# Patient Record
Sex: Female | Born: 1945 | Race: White | Hispanic: No | Smoking: Never smoker
Health system: Southern US, Community
[De-identification: ages and names within clinical notes are randomized; demographics above are authoritative.]

## PROBLEM LIST (undated history)

## (undated) DIAGNOSIS — T8859XA Other complications of anesthesia, initial encounter: Secondary | ICD-10-CM

## (undated) DIAGNOSIS — I7300 Raynaud's syndrome without gangrene: Secondary | ICD-10-CM

## (undated) DIAGNOSIS — K589 Irritable bowel syndrome without diarrhea: Secondary | ICD-10-CM

## (undated) DIAGNOSIS — T4145XA Adverse effect of unspecified anesthetic, initial encounter: Secondary | ICD-10-CM

## (undated) DIAGNOSIS — Z9889 Other specified postprocedural states: Secondary | ICD-10-CM

## (undated) DIAGNOSIS — M1990 Unspecified osteoarthritis, unspecified site: Secondary | ICD-10-CM

## (undated) DIAGNOSIS — M8580 Other specified disorders of bone density and structure, unspecified site: Secondary | ICD-10-CM

## (undated) DIAGNOSIS — R112 Nausea with vomiting, unspecified: Secondary | ICD-10-CM

## (undated) HISTORY — PX: BREAST LUMPECTOMY: SHX2

## (undated) HISTORY — DX: Raynaud's syndrome without gangrene: I73.00

## (undated) HISTORY — DX: Irritable bowel syndrome, unspecified: K58.9

## (undated) HISTORY — DX: Other specified disorders of bone density and structure, unspecified site: M85.80

## (undated) HISTORY — DX: Unspecified osteoarthritis, unspecified site: M19.90

## (undated) HISTORY — PX: DILATION AND CURETTAGE OF UTERUS: SHX78

---

## 1999-01-01 ENCOUNTER — Ambulatory Visit (HOSPITAL_BASED_OUTPATIENT_CLINIC_OR_DEPARTMENT_OTHER): Admission: RE | Admit: 1999-01-01 | Discharge: 1999-01-01 | Payer: Self-pay | Admitting: Otolaryngology

## 1999-11-17 ENCOUNTER — Other Ambulatory Visit: Admission: RE | Admit: 1999-11-17 | Discharge: 1999-11-17 | Payer: Self-pay | Admitting: Obstetrics and Gynecology

## 2000-06-20 ENCOUNTER — Other Ambulatory Visit: Admission: RE | Admit: 2000-06-20 | Discharge: 2000-06-20 | Payer: Self-pay | Admitting: Obstetrics and Gynecology

## 2001-01-10 ENCOUNTER — Other Ambulatory Visit: Admission: RE | Admit: 2001-01-10 | Discharge: 2001-01-10 | Payer: Self-pay | Admitting: Obstetrics and Gynecology

## 2002-01-30 ENCOUNTER — Other Ambulatory Visit: Admission: RE | Admit: 2002-01-30 | Discharge: 2002-01-30 | Payer: Self-pay | Admitting: Obstetrics and Gynecology

## 2003-02-04 ENCOUNTER — Other Ambulatory Visit: Admission: RE | Admit: 2003-02-04 | Discharge: 2003-02-04 | Payer: Self-pay | Admitting: Obstetrics and Gynecology

## 2004-02-17 ENCOUNTER — Other Ambulatory Visit: Admission: RE | Admit: 2004-02-17 | Discharge: 2004-02-17 | Payer: Self-pay | Admitting: Obstetrics and Gynecology

## 2005-01-14 ENCOUNTER — Ambulatory Visit: Payer: Self-pay | Admitting: Internal Medicine

## 2005-01-28 ENCOUNTER — Ambulatory Visit: Payer: Self-pay | Admitting: Internal Medicine

## 2005-02-17 ENCOUNTER — Other Ambulatory Visit: Admission: RE | Admit: 2005-02-17 | Discharge: 2005-02-17 | Payer: Self-pay | Admitting: Obstetrics and Gynecology

## 2010-09-21 ENCOUNTER — Encounter: Payer: Self-pay | Admitting: Internal Medicine

## 2010-10-14 ENCOUNTER — Encounter (INDEPENDENT_AMBULATORY_CARE_PROVIDER_SITE_OTHER): Payer: Self-pay | Admitting: *Deleted

## 2010-10-15 ENCOUNTER — Ambulatory Visit
Admission: RE | Admit: 2010-10-15 | Discharge: 2010-10-15 | Payer: Self-pay | Source: Home / Self Care | Attending: Internal Medicine | Admitting: Internal Medicine

## 2010-10-21 NOTE — Letter (Signed)
Summary: [facility] Instructions  [facility]  [address] [place], [place] [address]   Phone: [phone]  Fax: [phone]       Elizabeth Kidd    1946-05-27    MRN: [id number]        Procedure Day Dorna Bloom:  Farrell Ours  10/29/10     Arrival Time:  10:00AM      Procedure Time:  11:00AM     Location of Procedure:                    Juliann Pares _  [facility] (4th Floor)                      PREPARATION FOR COLONOSCOPY WITH MOVIPREP   Starting 5 days prior to your procedure 10/24/10 do not eat nuts, seeds, popcorn, corn, beans, peas,  salads, or any raw vegetables.  Do not take any fiber supplements (e.g. Metamucil, Citrucel, and Benefiber).  THE DAY BEFORE YOUR PROCEDURE         DATE: 10/28/10  DAY: THURSDAY  1.  Drink clear liquids the entire day-NO SOLID FOOD  2.  Do not drink anything colored red or purple.  Avoid juices with pulp.  No orange juice.  3.  Drink at least 64 oz. (8 glasses) of fluid/clear liquids during the day to prevent dehydration and help the prep work efficiently.  CLEAR LIQUIDS INCLUDE: Water Jello Ice Popsicles Tea (sugar ok, no milk/cream) Powdered fruit flavored drinks Coffee (sugar ok, no milk/cream) Gatorade Juice: apple, white grape, white cranberry  Lemonade Clear bullion, consomm, broth Carbonated beverages (any kind) Strained chicken noodle soup Hard Candy                             4.  In the morning, mix first dose of MoviPrep solution:    Empty 1 Pouch A and 1 Pouch B into the disposable container    Add lukewarm drinking water to the top line of the container. Mix to dissolve    Refrigerate (mixed solution should be used within 24 hrs)  5.  Begin drinking the prep at 5:00 p.m. The MoviPrep container is divided by 4 marks.   Every 15 minutes drink the solution down to the next mark (approximately 8 oz) until the full liter is complete.   6.  Follow completed prep with 16 oz of clear liquid of your choice (Nothing  red or purple).  Continue to drink clear liquids until bedtime.  7.  Before going to bed, mix second dose of MoviPrep solution:    Empty 1 Pouch A and 1 Pouch B into the disposable container    Add lukewarm drinking water to the top line of the container. Mix to dissolve    Refrigerate  THE DAY OF YOUR PROCEDURE      DATE: 10/29/10  DAY: FRIDAY  Beginning at 6:00AM (5 hours before procedure):         1. Every 15 minutes, drink the solution down to the next mark (approx 8 oz) until the full liter is complete.  2. Follow completed prep with 16 oz. of clear liquid of your choice.    3. You may drink clear liquids until 9:00AM (2 HOURS BEFORE PROCEDURE).   MEDICATION INSTRUCTIONS  Unless otherwise instructed, you should take regular prescription medications with a small sip of water   as early as possible the morning of  your procedure.        OTHER INSTRUCTIONS  You will need a responsible adult at least 66 years of age to accompany you and drive you home.   This person must remain in the waiting room during your procedure.  Wear loose fitting clothing that is easily removed.  Leave jewelry and other valuables at home.  However, you may wish to bring a book to read or  an iPod/MP3 player to listen to music as you wait for your procedure to start.  Remove all body piercing jewelry and leave at home.  Total time from sign-in until discharge is approximately 2-3 hours.  You should go home directly after your procedure and rest.  You can resume normal activities the  day after your procedure.  The day of your procedure you should not:   Drive   Make legal decisions   Operate machinery   Drink alcohol   Return to work  You will receive specific instructions about eating, activities and medications before you leave.    The above instructions have been reviewed and explained to me by  Wyona Almas RN  October 15, 2010 11:36 AM     I fully understand and can  verbalize these instructions _____________________________ Date _________

## 2010-10-21 NOTE — Letter (Signed)
Summary: Pre Visit Letter Revised  [facility]  [address] [place], [place] [address]   Phone: [phone]  Fax: [phone]        09/21/2010 MRN: [id number]  Elizabeth Kidd [address] [place], [place]  [address]             Procedure Date:  2-10 at 11am             Dr Marina Goodell - Direct Colon  Welcome to the Gastroenterology Division at [facility].    You are scheduled to see a nurse for your pre-procedure visit on 10-15-10 at 11am on the 3rd floor at [facility], [address]  We ask that you try to arrive at our office 15 minutes prior to your appointment time to allow for check-in.  Please take a minute to review the attached form.  If you answer "Yes" to one or more of the questions on the first page, we ask that you call the person listed at your earliest opportunity.  If you answer "No" to all of the questions, please complete the rest of the form and bring it to your appointment.    Your nurse visit will consist of discussing your medical and surgical history, your immediate family medical history, and your medications.   If you are unable to list all of your medications on the form, please bring the medication bottles to your appointment and we will list them.  We will need to be aware of both prescribed and over the counter drugs.  We will need to know exact dosage information as well.    Please be prepared to read and sign documents such as consent forms, a financial agreement, and acknowledgement forms.  If necessary, and with your consent, a friend or relative is welcome to sit-in on the nurse visit with you.  Please bring your insurance card so that we may make a copy of it.  If your insurance requires a referral to see a specialist, please bring your referral form from your primary care physician.  No co-pay is required for this nurse visit.     If you cannot keep your appointment, please call [phone] to cancel or reschedule prior to  your appointment date.  This allows [place] the opportunity to schedule an appointment for another patient in need of care.    Thank you for choosing [facility] for your medical needs.  We appreciate the opportunity to care for you.  Please visit [place] at our website  to learn more about our practice.  Sincerely, The Gastroenterology Division

## 2010-10-21 NOTE — Miscellaneous (Signed)
Summary: [facility] Previsit/prep  Clinical Lists Changes  Medications: Added new medication of MOVIPREP 100 GM  SOLR (PEG-KCL-NACL-NASULF-NA ASC-C) As per prep instructions. - Signed Rx of MOVIPREP 100 GM  SOLR (PEG-KCL-NACL-NASULF-NA ASC-C) As per prep instructions.;  #1 x 0;  Signed;  Entered by: Wyona Almas RN;  Authorized by: Hilarie Fredrickson MD;  Method used: Electronically to [facility] [address] [place], [place], [place]  [address], Ph: [phone] or [phone], Fax: [phone] Observations: Added new observation of NKA: T (10/15/2010 10:37)    Prescriptions: MOVIPREP 100 GM  SOLR (PEG-KCL-NACL-NASULF-NA ASC-C) As per prep instructions.  #1 x 0   Entered by:   Wyona Almas RN   Authorized by:   Hilarie Fredrickson MD   Signed by:   Wyona Almas RN on 10/15/2010   Method used:   Electronically to        [facility] [address] (retail)       [address]       [place], [place]  [address]       Ph: [phone] or [phone]       Fax: [phone]   RxID:   [id number]

## 2010-10-29 ENCOUNTER — Other Ambulatory Visit: Payer: Self-pay | Admitting: Internal Medicine

## 2010-10-29 ENCOUNTER — Other Ambulatory Visit (AMBULATORY_SURGERY_CENTER): Payer: Medicare Other | Admitting: Internal Medicine

## 2010-10-29 DIAGNOSIS — Z1211 Encounter for screening for malignant neoplasm of colon: Secondary | ICD-10-CM

## 2010-10-29 DIAGNOSIS — D126 Benign neoplasm of colon, unspecified: Secondary | ICD-10-CM

## 2010-10-29 DIAGNOSIS — Z86010 Personal history of colonic polyps: Secondary | ICD-10-CM

## 2010-11-02 ENCOUNTER — Encounter: Payer: Self-pay | Admitting: Internal Medicine

## 2010-11-10 NOTE — Letter (Signed)
Summary: Patient Notice- Polyp Results  [facility]  [address] [place], [place] [address]   Phone: [phone]  Fax: [phone]        November 02, 2010 MRN: [id number]    [facility] [address] [place], [place]  [address]    Dear Ms. Elizabeth Kidd,  I am pleased to inform you that the colon polyp(s) removed during your recent colonoscopy was (were) found to be benign (no cancer detected) upon pathologic examination.  I recommend you have a repeat colonoscopy examination in 3 years to look for recurrent polyps, as having colon polyps increases your risk for having recurrent polyps or even colon cancer in the future.  Should you develop new or worsening symptoms of abdominal pain, bowel habit changes or bleeding from the rectum or bowels, please schedule an evaluation with either your primary care physician or with me.  Additional information/recommendations:  __ No further action with gastroenterology is needed at this time. Please      follow-up with your primary care physician for your other healthcare      needs.   Please call us if you are having persistent problems or have questions about your condition that have not been fully answered at this time.  Sincerely,  Hilarie Fredrickson MD  This letter has been electronically signed by your physician.  Appended Document: Patient Notice- Polyp Results Letter Mailed  Appended Document: Patient Notice- Polyp Results Letter mailed

## 2010-11-10 NOTE — Procedures (Signed)
Summary: Colonoscopy   Colonoscopy  Procedure date:  10/29/2010  Findings:      Location:  [facility].   COLONOSCOPY PROCEDURE REPORT  PATIENT:  Elizabeth Kidd, Elizabeth Kidd  MR#:  [id number] BIRTHDATE:   02/08/1946, 64 yrs. old   GENDER:   female ENDOSCOPIST:   Wilhemina Bonito. Eda Keys, MD REF. BY: Surveillance Program Recall, PROCEDURE DATE:  10/29/2010 PROCEDURE:  Colonoscopy with snare polypectomy x 1 ASA CLASS:   Class II INDICATIONS: history of pre-cancerous (adenomatous) colon polyps, surveillance and high-risk screening ; index 01-2005 w/ small adenomas MEDICATIONS:    Fentanyl 50 mcg IV, Versed 5 mg IV  DESCRIPTION OF PROCEDURE:   After the risks benefits and alternatives of the procedure were thoroughly explained, informed consent was obtained.  Digital rectal exam was performed and revealed no abnormalities.   The LB 180AL [id number] endoscope was introduced through the anus and advanced to the cecum, which was identified by both the appendix and ileocecal valve, without limitations.Time to cecum = 2:42 min.  The quality of the prep was excellent, using MoviPrep.  The instrument was then slowly withdrawn (time = 14:20 min) as the colon was fully examined. <<PROCEDUREIMAGES>>        <<OLD IMAGES>>  FINDINGS:  A 12mm sessile polyp was found in the ascending colon. Polyp was snared without cautery. Retrieval was successful.   Otherwise normal colonoscopy without other polyps, masses, vascular ectasias, or inflammatory changes.   Retroflexed views in the rectum revealed no abnormalities.    The scope was then withdrawn from the patient and the procedure completed.  COMPLICATIONS:   None ENDOSCOPIC IMPRESSION:  1) 12mm Sessile polyp in the ascending colon - removed  2) Otherwise normall colonoscopy   RECOMMENDATIONS:  1) Repeat Colonoscopy in 3 years (advanced lesion).  _______________________________ Wilhemina Bonito. Eda Keys, MD  CC: Geoffry Paradise, MD; The Patient    Appended  Document: Colonoscopy recall 3 yrs     Procedures Next Due Date:    Colonoscopy: 10/2013

## 2011-03-19 ENCOUNTER — Emergency Department (HOSPITAL_BASED_OUTPATIENT_CLINIC_OR_DEPARTMENT_OTHER)
Admission: EM | Admit: 2011-03-19 | Discharge: 2011-03-20 | Disposition: A | Discharge status: Home / Self Care | Payer: Medicare Other | Attending: Emergency Medicine | Admitting: Emergency Medicine

## 2011-03-19 ENCOUNTER — Emergency Department (INDEPENDENT_AMBULATORY_CARE_PROVIDER_SITE_OTHER): Payer: Medicare Other

## 2011-03-19 DIAGNOSIS — R350 Frequency of micturition: Secondary | ICD-10-CM | POA: Insufficient documentation

## 2011-03-19 DIAGNOSIS — R509 Fever, unspecified: Secondary | ICD-10-CM | POA: Insufficient documentation

## 2011-03-19 DIAGNOSIS — IMO0001 Reserved for inherently not codable concepts without codable children: Secondary | ICD-10-CM | POA: Insufficient documentation

## 2011-03-19 DIAGNOSIS — R51 Headache: Secondary | ICD-10-CM

## 2011-03-19 DIAGNOSIS — J438 Other emphysema: Secondary | ICD-10-CM

## 2011-03-19 LAB — BASIC METABOLIC PANEL
Chloride: 101 mEq/L (ref 96–112)
GFR calc Af Amer: >60 mL/min (ref >60)
Potassium: 3.7 mEq/L (ref 3.5–5.1)

## 2011-03-19 LAB — CBC
Platelets: 178 10*3/uL (ref 150–400)
RDW: 15.7 % — ABNORMAL HIGH (ref 11.5–15.5)
WBC: 9.2 10*3/uL (ref 4.0–10.5)

## 2011-03-19 LAB — URINALYSIS, ROUTINE W REFLEX MICROSCOPIC
Bilirubin Urine: NEGATIVE
Glucose, UA: NEGATIVE mg/dL
Hgb urine dipstick: NEGATIVE
Ketones, ur: NEGATIVE mg/dL
pH: 7 (ref 5.0–8.0)

## 2011-03-19 LAB — DIFFERENTIAL
Basophils Absolute: 0 10*3/uL (ref 0.0–0.1)
Eosinophils Absolute: 0.1 10*3/uL (ref 0.0–0.7)
Eosinophils Relative: 1 % (ref 0–5)

## 2011-03-26 LAB — CULTURE, BLOOD (ROUTINE X 2)
Culture  Setup Time: 201207010056
Culture: NO GROWTH

## 2013-10-25 ENCOUNTER — Encounter: Payer: Self-pay | Admitting: Internal Medicine

## 2013-12-02 ENCOUNTER — Encounter: Payer: Self-pay | Admitting: Internal Medicine

## 2013-12-31 ENCOUNTER — Ambulatory Visit (AMBULATORY_SURGERY_CENTER): Payer: Self-pay

## 2013-12-31 VITALS — Ht 59.0 in | Wt 117.0 lb

## 2013-12-31 DIAGNOSIS — Z860100 Personal history of colon polyps, unspecified: Secondary | ICD-10-CM

## 2013-12-31 MED ORDER — MOVIPREP 100 G PO SOLR: 1.0000 | Freq: Once | ORAL | Status: DC

## 2014-01-02 ENCOUNTER — Encounter: Payer: Self-pay | Admitting: Internal Medicine

## 2014-01-21 ENCOUNTER — Encounter: Payer: Self-pay | Admitting: Internal Medicine

## 2014-01-21 ENCOUNTER — Ambulatory Visit (AMBULATORY_SURGERY_CENTER): Payer: Medicare Other | Admitting: Internal Medicine

## 2014-01-21 VITALS — BP 126/67 | HR 65 | Temp 97.6°F | Resp 16 | Ht 59.0 in | Wt 117.0 lb

## 2014-01-21 DIAGNOSIS — D126 Benign neoplasm of colon, unspecified: Secondary | ICD-10-CM

## 2014-01-21 DIAGNOSIS — Z86010 Personal history of colonic polyps: Secondary | ICD-10-CM

## 2014-01-21 MED ORDER — SODIUM CHLORIDE 0.9 % IV SOLN: 500.0000 mL | INTRAVENOUS | Status: DC

## 2014-01-21 NOTE — Op Note (Signed)
[facility]  [address] [place], [address]   COLONOSCOPY PROCEDURE REPORT  PATIENT: Elizabeth Kidd, Elizabeth Kidd  MR#: [id number] BIRTHDATE: [birth year] , 67  yrs. old GENDER: Female ENDOSCOPIST: Eustace Quail, MD REFERRED SR:[id number] Program Recall PROCEDURE DATE:  01/21/2014 PROCEDURE:   Colonoscopy with snare polypectomy x 1 First Screening Colonoscopy - Avg.  risk and is 50 yrs.  old or older - No.  Prior Negative Screening - Now for repeat screening. N/A  History of Adenoma - Now for follow-up colonoscopy & has been > or = to 3 yrs.  Yes hx of adenoma.  Has been 3 or more years since last colonoscopy.  Polyps Removed Today? Yes. ASA CLASS:   Class I INDICATIONS:Patient's personal history of adenomatous colon polyps. Small TA 2006; 40mm TA 10-2010 MEDICATIONS: MAC sedation, administered by CRNA and propofol (Diprivan) 230mg  IV  DESCRIPTION OF PROCEDURE:   After the risks benefits and alternatives of the procedure were thoroughly explained, informed consent was obtained.  A digital rectal exam revealed no abnormalities of the rectum.   The LB [id number]  endoscope was introduced through the anus and advanced to the cecum, which was identified by both the appendix and ileocecal valve. No adverse events experienced.   The quality of the prep was adequate, using MoviPrep  The instrument was then slowly withdrawn as the colon was fully examined.  COLON FINDINGS: A single polyp measuring 6 mm in size was found in the sigmoid colon.  A polypectomy was performed with a cold snare. The resection was complete and the polyp tissue was completely retrieved.   The colon was otherwise normal.  There was no diverticulosis, inflammation, other polyps or cancers . Retroflexed views revealed no abnormalities. The time to cecum=4 minutes 30 seconds.  Withdrawal time=12 minutes 26 seconds.  The scope was withdrawn and the procedure completed. COMPLICATIONS: There were no  complications.  ENDOSCOPIC IMPRESSION: 1.   Single polyp measuring 6 mm in size was found in the sigmoid colon; polypectomy was performed with a cold snare 2.   The colon was otherwise normal  RECOMMENDATIONS: 1. Follow up colonoscopy in 5 years   eSigned:  Eustace Quail, MD 01/21/2014 8:49 AM   cc: Burnard Bunting, MD and The Patient

## 2014-01-21 NOTE — Progress Notes (Signed)
Called to room to assist during endoscopic procedure.  Patient ID and intended procedure confirmed with present staff. Received instructions for my participation in the procedure from the performing physician.  

## 2014-01-21 NOTE — Patient Instructions (Signed)
YOU HAD AN ENDOSCOPIC PROCEDURE TODAY AT THE [facility]: Refer to the procedure report that was given to you for any specific questions about what was found during the examination.  If the procedure report does not answer your questions, please call your gastroenterologist to clarify.  If you requested that your care partner not be given the details of your procedure findings, then the procedure report has been included in a sealed envelope for you to review at your convenience later.  YOU SHOULD EXPECT: Some feelings of bloating in the abdomen. Passage of more gas than usual.  Walking can help get rid of the air that was put into your GI tract during the procedure and reduce the bloating. If you had a lower endoscopy (such as a colonoscopy or flexible sigmoidoscopy) you may notice spotting of blood in your stool or on the toilet paper. If you underwent a bowel prep for your procedure, then you may not have a normal bowel movement for a few days.  DIET: Your first meal following the procedure should be a light meal and then it is ok to progress to your normal diet.  A half-sandwich or bowl of soup is an example of a good first meal.  Heavy or fried foods are harder to digest and may make you feel nauseous or bloated.  Likewise meals heavy in dairy and vegetables can cause extra gas to form and this can also increase the bloating.  Drink plenty of fluids but you should avoid alcoholic beverages for 24 hours.  ACTIVITY: Your care partner should take you home directly after the procedure.  You should plan to take it easy, moving slowly for the rest of the day.  You can resume normal activity the day after the procedure however you should NOT DRIVE or use heavy machinery for 24 hours (because of the sedation medicines used during the test).    SYMPTOMS TO REPORT IMMEDIATELY: A gastroenterologist can be reached at any hour.  During normal business hours, 8:30 AM to 5:00 PM Monday through Friday,  call [phone].  After hours and on weekends, please call the GI answering service at [phone] who will take a message and have the physician on call contact you.   Following lower endoscopy (colonoscopy or flexible sigmoidoscopy):  Excessive amounts of blood in the stool  Significant tenderness or worsening of abdominal pains  Swelling of the abdomen that is new, acute  Fever of 100F or higher    FOLLOW UP: If any biopsies were taken you will be contacted by phone or by letter within the next 1-3 weeks.  Call your gastroenterologist if you have not heard about the biopsies in 3 weeks.  Our staff will call the home number listed on your records the next business day following your procedure to check on you and address any questions or concerns that you may have at that time regarding the information given to you following your procedure. This is a courtesy call and so if there is no answer at the home number and we have not heard from you through the emergency physician on call, we will assume that you have returned to your regular daily activities without incident.  SIGNATURES/CONFIDENTIALITY: You and/or your care partner have signed paperwork which will be entered into your electronic medical record.  These signatures attest to the fact that that the information above on your After Visit Summary has been reviewed and is understood.  Full responsibility of the confidentiality   of this discharge information lies with you and/or your care-partner.     

## 2014-01-21 NOTE — Progress Notes (Signed)
Report to pacu rn, vss, bbs=clear 

## 2014-01-22 ENCOUNTER — Telehealth: Payer: Self-pay | Admitting: *Deleted

## 2014-01-22 NOTE — Telephone Encounter (Signed)
  Follow up Call-  Call back number 01/21/2014  Post procedure Call Back phone  # [phone]  Permission to leave phone message Yes     Patient questions:  Do you have a fever, pain , or abdominal swelling? no Pain Score  0 *  Have you tolerated food without any problems? yes  Have you been able to return to your normal activities? yes  Do you have any questions about your discharge instructions: Diet   no Medications  no Follow up visit  no  Do you have questions or concerns about your Care? no  Actions: * If pain score is 4 or above: No action needed, pain <4.

## 2014-01-28 ENCOUNTER — Encounter: Payer: Self-pay | Admitting: Internal Medicine

## 2014-05-07 ENCOUNTER — Encounter: Payer: Self-pay | Admitting: Internal Medicine

## 2014-10-04 ENCOUNTER — Emergency Department (HOSPITAL_BASED_OUTPATIENT_CLINIC_OR_DEPARTMENT_OTHER)
Admission: EM | Admit: 2014-10-04 | Discharge: 2014-10-04 | Disposition: A | Discharge status: Home / Self Care | Payer: Medicare Other | Attending: Emergency Medicine | Admitting: Emergency Medicine

## 2014-10-04 ENCOUNTER — Encounter (HOSPITAL_BASED_OUTPATIENT_CLINIC_OR_DEPARTMENT_OTHER): Payer: Self-pay | Admitting: Emergency Medicine

## 2014-10-04 DIAGNOSIS — Z8742 Personal history of other diseases of the female genital tract: Secondary | ICD-10-CM | POA: Diagnosis not present

## 2014-10-04 DIAGNOSIS — K644 Residual hemorrhoidal skin tags: Secondary | ICD-10-CM | POA: Insufficient documentation

## 2014-10-04 DIAGNOSIS — L259 Unspecified contact dermatitis, unspecified cause: Secondary | ICD-10-CM | POA: Insufficient documentation

## 2014-10-04 DIAGNOSIS — B379 Candidiasis, unspecified: Secondary | ICD-10-CM

## 2014-10-04 DIAGNOSIS — R21 Rash and other nonspecific skin eruption: Secondary | ICD-10-CM | POA: Diagnosis present

## 2014-10-04 DIAGNOSIS — B3789 Other sites of candidiasis: Secondary | ICD-10-CM | POA: Diagnosis not present

## 2014-10-04 DIAGNOSIS — Z79899 Other long term (current) drug therapy: Secondary | ICD-10-CM | POA: Insufficient documentation

## 2014-10-04 DIAGNOSIS — K649 Unspecified hemorrhoids: Secondary | ICD-10-CM

## 2014-10-04 MED ORDER — NYSTATIN-TRIAMCINOLONE 100000-0.1 UNIT/GM-% EX CREA: TOPICAL_CREAM | CUTANEOUS | Status: DC

## 2014-10-04 NOTE — ED Notes (Signed)
Pt presents to ED with complaints of allergic reaction for the past few days and getting worse. PT used hemorrhoid cream and now has a weeping rash in that area.

## 2014-10-04 NOTE — ED Provider Notes (Signed)
TIME SEEN: 12:00 PM  CHIEF COMPLAINT: Possible allergic reaction  HPI: Patient is a 69 y.o. F with history of benign breast tumor status post lumpectomy who presents emergency department with complaints of hemorrhoids and now a rash around that area after using over-the-counter Benadryl cream. States that she has been using Preparation H for hemorrhoids but they began to itch so she put Benadryl cream around her bottom. Since that time she has noticed increased redness, swelling, clear drainage and itching. She is concerned that she may have a contact dermatitis. No other new soaps, lotions, detergents, medications or foods. No other rash.  ROS: See HPI Constitutional: no fever  Eyes: no drainage  ENT: no runny nose   Cardiovascular:  no chest pain  Resp: no SOB  GI: no vomiting GU: no dysuria Integumentary: no rash  Allergy: no hives  Musculoskeletal: no leg swelling  Neurological: no slurred speech ROS otherwise negative  PAST MEDICAL HISTORY/PAST SURGICAL HISTORY:  History reviewed. No pertinent past medical history.  MEDICATIONS:  Prior to Admission medications   Medication Sig Start Date End Date Taking? Authorizing Provider  OVER THE COUNTER MEDICATION [place] [employer]-Plus vitamin daily    Historical Provider, MD    ALLERGIES:  No Known Allergies  SOCIAL HISTORY:  History  Substance Use Topics  . Smoking status: Never Smoker   . Smokeless tobacco: Never Used  . Alcohol Use: No    FAMILY HISTORY: Family History  Problem Relation Age of Onset  . Breast cancer Mother   . Colon cancer Neg Hx   . Pancreatic cancer Neg Hx   . Stomach cancer Neg Hx   . Rectal cancer Neg Hx     EXAM: BP 144/84 mmHg  Pulse 126  Temp(Src) 99.1 F (37.3 C) (Oral)  Resp 20  Ht 4\' 11"  (1.499 m)  Wt 117 lb (53.071 kg)  BMI 23.62 kg/m2  SpO2 97% CONSTITUTIONAL: Alert and oriented and responds appropriately to questions. Well-appearing; well-nourished HEAD: Normocephalic EYES:  Conjunctivae clear, PERRL ENT: normal nose; no rhinorrhea; moist mucous membranes; pharynx without lesions noted NECK: Supple, no meningismus, no LAD  CARD: RRR; S1 and S2 appreciated; no murmurs, no clicks, no rubs, no gallops RESP: Normal chest excursion without splinting or tachypnea; breath sounds clear and equal bilaterally; no wheezes, no rhonchi, no rales,  ABD/GI: Normal bowel sounds; non-distended; soft, non-tender, no rebound, no guarding RECTAL:  Patient has multiple nonbleeding, nonthrombosed external hemorrhoids, she has beefy-red areas around her anus and gluteal fold with clear drainage and excoriations, there are some small satellite lesions, no perineal crepitus or purulent drainage BACK:  The back appears normal and is non-tender to palpation, there is no CVA tenderness EXT: Normal ROM in all joints; non-tender to palpation; no edema; normal capillary refill; no cyanosis    SKIN: Normal color for age and race; warm NEURO: Moves all extremities equally PSYCH: The patient's mood and manner are appropriate. Grooming and personal hygiene are appropriate.  MEDICAL DECISION MAKING: Patient here with what appears to be a contact dermatitis versus yeast infection around the rectum. No sign of rectal or perineal abscess. No sign of necrotizing fasciitis. No signs of cellulitis. She is initially tachycardic but this improved without intervention. We'll discharge with prescription for nystatin/triamcinolone cream and will have her use over-the-counter barrier creams. Have advised her to stop using Benadryl cream. As for her hemorrhoids have recommend she continue Preparation H, Tucks witch hazel wipes, increasing water and fiber intake to keep her stool  soft. Discussed return precautions. She verbalized understanding and is comfortable with plan.     [place], DO 10/04/14 1554

## 2014-10-04 NOTE — Discharge Instructions (Signed)
Contact Dermatitis Contact dermatitis is a reaction to certain substances that touch the skin. Contact dermatitis can be either irritant contact dermatitis or allergic contact dermatitis. Irritant contact dermatitis does not require previous exposure to the substance for a reaction to occur.Allergic contact dermatitis only occurs if you have been exposed to the substance before. Upon a repeat exposure, your body reacts to the substance.  CAUSES  Many substances can cause contact dermatitis. Irritant dermatitis is most commonly caused by repeated exposure to mildly irritating substances, such as:  Makeup.  Soaps.  Detergents.  Bleaches.  Acids.  Metal salts, such as nickel. Allergic contact dermatitis is most commonly caused by exposure to:  Poisonous plants.  Chemicals (deodorants, shampoos).  Jewelry.  Latex.  Neomycin in triple antibiotic cream.  Preservatives in products, including clothing. SYMPTOMS  The area of skin that is exposed may develop:  Dryness or flaking.  Redness.  Cracks.  Itching.  Pain or a burning sensation.  Blisters. With allergic contact dermatitis, there may also be swelling in areas such as the eyelids, mouth, or genitals.  DIAGNOSIS  Your caregiver can usually tell what the problem is by doing a physical exam. In cases where the cause is uncertain and an allergic contact dermatitis is suspected, a patch skin test may be performed to help determine the cause of your dermatitis. TREATMENT Treatment includes protecting the skin from further contact with the irritating substance by avoiding that substance if possible. Barrier creams, powders, and gloves may be helpful. Your caregiver may also recommend:  Steroid creams or ointments applied 2 times daily. For best results, soak the rash area in cool water for 20 minutes. Then apply the medicine. Cover the area with a plastic wrap. You can store the steroid cream in the refrigerator for a "chilly"  effect on your rash. That may decrease itching. Oral steroid medicines may be needed in more severe cases.  Antibiotics or antibacterial ointments if a skin infection is present.  Antihistamine lotion or an antihistamine taken by mouth to ease itching.  Lubricants to keep moisture in your skin.  Burow's solution to reduce redness and soreness or to dry a weeping rash. Mix one packet or tablet of solution in 2 cups cool water. Dip a clean washcloth in the mixture, wring it out a bit, and put it on the affected area. Leave the cloth in place for 30 minutes. Do this as often as possible throughout the day.  Taking several cornstarch or baking soda baths daily if the area is too large to cover with a washcloth. Harsh chemicals, such as alkalis or acids, can cause skin damage that is like a burn. You should flush your skin for 15 to 20 minutes with cold water after such an exposure. You should also seek immediate medical care after exposure. Bandages (dressings), antibiotics, and pain medicine may be needed for severely irritated skin.  HOME CARE INSTRUCTIONS  Avoid the substance that caused your reaction.  Keep the area of skin that is affected away from hot water, soap, sunlight, chemicals, acidic substances, or anything else that would irritate your skin.  Do not scratch the rash. Scratching may cause the rash to become infected.  You may take cool baths to help stop the itching.  Only take over-the-counter or prescription medicines as directed by your caregiver.  See your caregiver for follow-up care as directed to make sure your skin is healing properly. SEEK MEDICAL CARE IF:   Your condition is not better after 3  days of treatment.  You seem to be getting worse.  You see signs of infection such as swelling, tenderness, redness, soreness, or warmth in the affected area.  You have any problems related to your medicines. Document Released: 09/02/2000 Document Revised: 11/28/2011  Document Reviewed: 02/08/2011 [facility] Patient Information 2015 [place], [place]. This information is not intended to replace advice given to you by your health care provider. Make sure you discuss any questions you have with your health care provider.  Candida Infection A Candida infection (also called yeast, fungus, and Monilia infection) is an overgrowth of yeast that can occur anywhere on the body. A yeast infection commonly occurs in warm, moist body areas. Usually, the infection remains localized but can spread to become a systemic infection. A yeast infection may be a sign of a more severe disease such as diabetes, leukemia, or AIDS. A yeast infection can occur in both men and women. In women, Candida vaginitis is a vaginal infection. It is one of the most common causes of vaginitis. Men usually do not have symptoms or know they have an infection until other problems develop. Men may find out they have a yeast infection because their sex partner has a yeast infection. Uncircumcised men are more likely to get a yeast infection than circumcised men. This is because the uncircumcised glans is not exposed to air and does not remain as dry as that of a circumcised glans. Older adults may develop yeast infections around dentures. CAUSES  Women  Antibiotics.  Steroid medication taken for a long time.  Being overweight (obese).  Diabetes.  Poor immune condition.  Certain serious medical conditions.  Immune suppressive medications for organ transplant patients.  Chemotherapy.  Pregnancy.  Menstruation.  Stress and fatigue.  Intravenous drug use.  Oral contraceptives.  Wearing tight-fitting clothes in the crotch area.  Catching it from a sex partner who has a yeast infection.  Spermicide.  Intravenous, urinary, or other catheters. Men  Catching it from a sex partner who has a yeast infection.  Having oral or anal sex with a person who has the  infection.  Spermicide.  Diabetes.  Antibiotics.  Poor immune system.  Medications that suppress the immune system.  Intravenous drug use.  Intravenous, urinary, or other catheters. SYMPTOMS  Women  Thick, white vaginal discharge.  Vaginal itching.  Redness and swelling in and around the vagina.  Irritation of the lips of the vagina and perineum.  Blisters on the vaginal lips and perineum.  Painful sexual intercourse.  Low blood sugar (hypoglycemia).  Painful urination.  Bladder infections.  Intestinal problems such as constipation, indigestion, bad breath, bloating, increase in gas, diarrhea, or loose stools. Men  Men may develop intestinal problems such as constipation, indigestion, bad breath, bloating, increase in gas, diarrhea, or loose stools.  Dry, cracked skin on the penis with itching or discomfort.  Jock itch.  Dry, flaky skin.  Athlete's foot.  Hypoglycemia. DIAGNOSIS  Women  A history and an exam are performed.  The discharge may be examined under a microscope.  A culture may be taken of the discharge. Men  A history and an exam are performed.  Any discharge from the penis or areas of cracked skin will be looked at under the microscope and cultured.  Stool samples may be cultured. TREATMENT  Women  Vaginal antifungal suppositories and creams.  Medicated creams to decrease irritation and itching on the outside of the vagina.  Warm compresses to the perineal area to decrease swelling and  discomfort.  Oral antifungal medications.  Medicated vaginal suppositories or cream for repeated or recurrent infections.  Wash and dry the irritation areas before applying the cream.  Eating yogurt with Lactobacillus may help with prevention and treatment.  Sometimes painting the vagina with gentian violet solution may help if creams and suppositories do not work. Men  Antifungal creams and oral antifungal medications.  Sometimes  treatment must continue for 30 days after the symptoms go away to prevent recurrence. HOME CARE INSTRUCTIONS  Women  Use cotton underwear and avoid tight-fitting clothing.  Avoid colored, scented toilet paper and deodorant tampons or pads.  Do not douche.  Keep your diabetes under control.  Finish all the prescribed medications.  Keep your skin clean and dry.  Consume milk or yogurt with Lactobacillus-active culture regularly. If you get frequent yeast infections and think that is what the infection is, there are over-the-counter medications that you can get. If the infection does not show healing in 3 days, talk to your caregiver.  Tell your sex partner you have a yeast infection. Your partner may need treatment also, especially if your infection does not clear up or recurs. Men  Keep your skin clean and dry.  Keep your diabetes under control.  Finish all prescribed medications.  Tell your sex partner that you have a yeast infection so he or she can be treated if necessary. SEEK MEDICAL CARE IF:   Your symptoms do not clear up or worsen in one week after treatment.  You have an oral temperature above 102 F (38.9 C).  You have trouble swallowing or eating for a prolonged time.  You develop blisters on and around your vagina.  You develop vaginal bleeding and it is not your menstrual period.  You develop abdominal pain.  You develop intestinal problems as mentioned above.  You get weak or light-headed.  You have painful or increased urination.  You have pain during sexual intercourse. MAKE SURE YOU:   Understand these instructions.  Will watch your condition.  Will get help right away if you are not doing well or get worse. Document Released: 10/13/2004 Document Revised: 01/20/2014 Document Reviewed: 01/25/2010 [facility] Patient Information 2015 [place], [place]. This information is not intended to replace advice given to you by your health care provider. Make  sure you discuss any questions you have with your health care provider.  Hemorrhoids Hemorrhoids are swollen veins around the rectum or anus. There are two types of hemorrhoids:   Internal hemorrhoids. These occur in the veins just inside the rectum. They may poke through to the outside and become irritated and painful.  External hemorrhoids. These occur in the veins outside the anus and can be felt as a painful swelling or hard lump near the anus. CAUSES  Pregnancy.   Obesity.   Constipation or diarrhea.   Straining to have a bowel movement.   Sitting for long periods on the toilet.  Heavy lifting or other activity that caused you to strain.  Anal intercourse. SYMPTOMS   Pain.   Anal itching or irritation.   Rectal bleeding.   Fecal leakage.   Anal swelling.   One or more lumps around the anus.  DIAGNOSIS  Your caregiver may be able to diagnose hemorrhoids by visual examination. Other examinations or tests that may be performed include:   Examination of the rectal area with a gloved hand (digital rectal exam).   Examination of anal canal using a small tube (scope).   A blood test if  you have lost a significant amount of blood.  A test to look inside the colon (sigmoidoscopy or colonoscopy). TREATMENT Most hemorrhoids can be treated at home. However, if symptoms do not seem to be getting better or if you have a lot of rectal bleeding, your caregiver may perform a procedure to help make the hemorrhoids get smaller or remove them completely. Possible treatments include:   Placing a rubber band at the base of the hemorrhoid to cut off the circulation (rubber band ligation).   Injecting a chemical to shrink the hemorrhoid (sclerotherapy).   Using a tool to burn the hemorrhoid (infrared light therapy).   Surgically removing the hemorrhoid (hemorrhoidectomy).   Stapling the hemorrhoid to block blood flow to the tissue (hemorrhoid stapling).  HOME  CARE INSTRUCTIONS   Eat foods with fiber, such as whole grains, beans, nuts, fruits, and vegetables. Ask your doctor about taking products with added fiber in them (fibersupplements).  Increase fluid intake. Drink enough water and fluids to keep your urine clear or pale yellow.   Exercise regularly.   Go to the bathroom when you have the urge to have a bowel movement. Do not wait.   Avoid straining to have bowel movements.   Keep the anal area dry and clean. Use wet toilet paper or moist towelettes after a bowel movement.   Medicated creams and suppositories may be used or applied as directed.   Only take over-the-counter or prescription medicines as directed by your caregiver.   Take warm sitz baths for 15-20 minutes, 3-4 times a day to ease pain and discomfort.   Place ice packs on the hemorrhoids if they are tender and swollen. Using ice packs between sitz baths may be helpful.   Put ice in a plastic bag.   Place a towel between your skin and the bag.   Leave the ice on for 15-20 minutes, 3-4 times a day.   Do not use a donut-shaped pillow or sit on the toilet for long periods. This increases blood pooling and pain.  SEEK MEDICAL CARE IF:  You have increasing pain and swelling that is not controlled by treatment or medicine.  You have uncontrolled bleeding.  You have difficulty or you are unable to have a bowel movement.  You have pain or inflammation outside the area of the hemorrhoids. MAKE SURE YOU:  Understand these instructions.  Will watch your condition.  Will get help right away if you are not doing well or get worse. Document Released: 09/02/2000 Document Revised: 08/22/2012 Document Reviewed: 07/10/2012 [facility] Patient Information 2015 [place], [place]. This information is not intended to replace advice given to you by your health care provider. Make sure you discuss any questions you have with your health care provider.

## 2017-10-31 ENCOUNTER — Other Ambulatory Visit (INDEPENDENT_AMBULATORY_CARE_PROVIDER_SITE_OTHER): Payer: Medicare Other

## 2017-10-31 ENCOUNTER — Encounter: Payer: Self-pay | Admitting: Internal Medicine

## 2017-10-31 ENCOUNTER — Encounter (INDEPENDENT_AMBULATORY_CARE_PROVIDER_SITE_OTHER): Payer: Self-pay

## 2017-10-31 ENCOUNTER — Ambulatory Visit: Payer: Medicare Other | Admitting: Internal Medicine

## 2017-10-31 VITALS — BP 124/76 | HR 80 | Ht 59.0 in | Wt 106.4 lb

## 2017-10-31 DIAGNOSIS — R109 Unspecified abdominal pain: Secondary | ICD-10-CM

## 2017-10-31 DIAGNOSIS — R17 Unspecified jaundice: Secondary | ICD-10-CM | POA: Diagnosis not present

## 2017-10-31 DIAGNOSIS — R634 Abnormal weight loss: Secondary | ICD-10-CM

## 2017-10-31 DIAGNOSIS — R195 Other fecal abnormalities: Secondary | ICD-10-CM | POA: Diagnosis not present

## 2017-10-31 LAB — COMPREHENSIVE METABOLIC PANEL
ALK PHOS: 524 U/L — AB (ref 39–117)
ALT: 81 U/L — ABNORMAL HIGH (ref 0–35)
AST: 84 U/L — AB (ref 0–37)
Albumin: 3.9 g/dL (ref 3.5–5.2)
BUN: 15 mg/dL (ref 6–23)
CALCIUM: 9.5 mg/dL (ref 8.4–10.5)
CO2: 30 mEq/L (ref 19–32)
CREATININE: 0.83 mg/dL (ref 0.40–1.20)
Chloride: 102 mEq/L (ref 96–112)
GFR: 71.77 mL/min (ref >60.00)
Glucose, Bld: 121 mg/dL — ABNORMAL HIGH (ref 70–99)
POTASSIUM: 4.3 meq/L (ref 3.5–5.1)
Sodium: 139 mEq/L (ref 135–145)
TOTAL PROTEIN: 7.2 g/dL (ref 6.0–8.3)
Total Bilirubin: 9.4 mg/dL — ABNORMAL HIGH (ref 0.2–1.2)

## 2017-10-31 LAB — CBC WITH DIFFERENTIAL/PLATELET
BASOS ABS: 0 10*3/uL (ref 0.0–0.1)
Basophils Relative: 0.9 % (ref 0.0–3.0)
EOS ABS: 0.1 10*3/uL (ref 0.0–0.7)
Eosinophils Relative: 1.3 % (ref 0.0–5.0)
HCT: 34.2 % — ABNORMAL LOW (ref 36.0–46.0)
Hemoglobin: 11.5 g/dL — ABNORMAL LOW (ref 12.0–15.0)
Lymphocytes Relative: 13 % (ref 12.0–46.0)
Lymphs Abs: 0.7 10*3/uL (ref 0.7–4.0)
MCHC: 33.5 g/dL (ref 30.0–36.0)
MCV: 101.3 fl — ABNORMAL HIGH (ref 78.0–100.0)
MONO ABS: 0.5 10*3/uL (ref 0.1–1.0)
Monocytes Relative: 9.6 % (ref 3.0–12.0)
Neutro Abs: 4.2 10*3/uL (ref 1.4–7.7)
Neutrophils Relative %: 75.2 % (ref 43.0–77.0)
Platelets: 170 10*3/uL (ref 150.0–400.0)
RBC: 3.38 Mil/uL — AB (ref 3.87–5.11)
RDW: 20 % — ABNORMAL HIGH (ref 11.5–15.5)
WBC: 5.6 10*3/uL (ref 4.0–10.5)

## 2017-10-31 LAB — PROTIME-INR
INR: 1.2 ratio — ABNORMAL HIGH (ref 0.8–1.0)
Prothrombin Time: 13.3 s — ABNORMAL HIGH (ref 9.6–13.1)

## 2017-10-31 NOTE — Progress Notes (Signed)
HISTORY OF PRESENT ILLNESS:  Elizabeth Kidd is a 72 y.o. female with minimal past medical history as listed below. She sent today by her primary care provider Dr. Reynaldo Minium for evaluation of abdominal complaints. The patient was last seen in this office May 2015 when she underwent surveillance colonoscopy for history of adenomatous colon polyps. The examination was normal except for the presence of a 6 mm sigmoid colon polyp which was removed and found to be adenomatous. Follow-up in 5 years recommended. Patient tells me that her current history is that of 2 months of bloating with nausea and occasional left-sided and suprapubic discomfort. She mentions that postprandially there is nausea without vomiting. She reports 5-6 pound weight loss. She was evaluated and felt possibly to have constipation for which she was treated with MiraLAX which resulted in diarrhea. She has mention some urgency to move her bowels but simply passes mucus. Outside office note evaluated. Rectal exam revealed heme positive stool. Patient states that she just doesn't feel well. Review of outside x-rays from 09/26/2017 she was unremarkable plain film of the abdomen. Review of laboratories from that same day reveals hemoglobin of 11.7 with MCV 101.5. Normal chemistries. No liver tests obtained. To me the patient is obviously jaundiced. Upon questioning she reports a one-month history of dark urine and a several week history of clay-colored stools. Her gallbladder is intact. No family history of pancreatic cancer.  REVIEW OF SYSTEMS:  All non-GI ROS negative unless otherwise stated in the history of present illness except for fatigue, arthritis  Past Medical History:  Diagnosis Date  . IBS (irritable bowel syndrome)   . Osteoarthritis   . Osteopenia   . Raynaud's disease     Past Surgical History:  Procedure Laterality Date  . BREAST LUMPECTOMY     fibroid tumor benign 1987    Social History Elizabeth Kidd  reports that   has never smoked. she has never used smokeless tobacco. She reports that she does not drink alcohol or use drugs.  family history includes Breast cancer in her mother.  No Known Allergies     PHYSICAL EXAMINATION: Vital signs: BP 124/76   Pulse 80   Ht 4\' 11"  (1.499 m)   Wt 106 lb 6.4 oz (48.3 kg)   BMI 21.49 kg/m   Constitutional: Thin, jaundiced, but generally well-appearing, no acute distress Psychiatric: alert and oriented x3, cooperative. Concerned appearing Eyes: extraocular movements intact, obvious scleral icterus, conjunctiva pink Mouth: oral pharynx moist, no lesions Neck: supple no lymphadenopathy Cardiovascular: heart regular rate and rhythm, no murmur Lungs: clear to auscultation bilaterally Abdomen: soft, nontender, slightly distended, no obvious ascites, no peritoneal signs, normal bowel sounds, no organomegaly Rectal: Per PCP last month. Not repeated Extremities: no clubbing, cyanosis, or lower extremity edema bilaterally Skin: no lesions on visible extremities Neuro: No focal deficits. Cranial nerves intact  ASSESSMENT:  #1. Abdominal bloating discomfort with nausea and abdominal distention. Obvious jaundice on today's examination. I am quite concerned about the possibility of occult malignancy with biliary obstruction. Alternatively, could have obstruction secondary to gallstones or nonobstructive hepatic cholestasis. #2. Hemoccult-positive stool #3. History of adenomatous colon polyps. Last colonoscopy 2015   PLAN:  #1. Laboratories today including CBC, comprehensive metabolic panel, PT/INR #2. Urgent contrast-enhanced CT scan of the abdomen and pelvis to evaluate abdominal discomfort, weight loss, and jaundice #3. Further recommendations after the above data available   60 minutes spent face-to-face with the patient. Greater than 50% a time use for counseling and coordination of  care regarding her abdominal complaints, jaundice, and weight loss.

## 2017-10-31 NOTE — Patient Instructions (Signed)
Your physician has requested that you go to the basement for the following lab work before leaving today: cbc, cmet, pt inr  You have been scheduled for a CT scan of the abdomen and pelvis at [facility] ([address]---this is in the same building as Press [facility]).   You are scheduled on 11/09/2017 at 2:30pm. You should arrive 15 minutes prior to your appointment time for registration. Please follow the written instructions below on the day of your exam:  WARNING: IF YOU ARE ALLERGIC TO IODINE/X-RAY DYE, PLEASE NOTIFY RADIOLOGY IMMEDIATELY AT [phone]! YOU WILL BE GIVEN A 13 HOUR PREMEDICATION PREP.  1) Do not eat anything after 10:30am (4 hours prior to your test) 2) You have been given 2 bottles of oral contrast to drink. The solution may taste               better if refrigerated, but do NOT add ice or any other liquid to this solution. Shake             well before drinking.    Drink 1 bottle of contrast @ 12:30pm (2 hours prior to your exam)  Drink 1 bottle of contrast @ 1:30pm (1 hour prior to your exam)  You may take any medications as prescribed with a small amount of water except for the following: Metformin, Glucophage, Glucovance, Avandamet, Riomet, Fortamet, Actoplus Met, Janumet, Glumetza or Metaglip. The above medications must be held the day of the exam AND 48 hours after the exam.  The purpose of you drinking the oral contrast is to aid in the visualization of your intestinal tract. The contrast solution may cause some diarrhea. Before your exam is started, you will be given a small amount of fluid to drink. Depending on your individual set of symptoms, you may also receive an intravenous injection of x-ray contrast/dye. Plan on being at [facility] for 30 minutes or long, depending on the type of exam you are having performed.  If you have any questions regarding your exam or if you need to reschedule, you may call the CT department at [phone]  between the hours of 8:00 am and 5:00 pm, Monday-Friday.  ________________________________________________________________________

## 2017-11-03 ENCOUNTER — Other Ambulatory Visit: Payer: Self-pay

## 2017-11-03 ENCOUNTER — Ambulatory Visit (INDEPENDENT_AMBULATORY_CARE_PROVIDER_SITE_OTHER)
Admission: RE | Admit: 2017-11-03 | Discharge: 2017-11-03 | Disposition: A | Discharge status: Home / Self Care | Payer: Medicare Other | Source: Ambulatory Visit | Attending: Internal Medicine | Admitting: Internal Medicine

## 2017-11-03 ENCOUNTER — Telehealth: Payer: Self-pay | Admitting: Internal Medicine

## 2017-11-03 ENCOUNTER — Encounter (HOSPITAL_COMMUNITY): Payer: Self-pay | Admitting: *Deleted

## 2017-11-03 DIAGNOSIS — R109 Unspecified abdominal pain: Secondary | ICD-10-CM

## 2017-11-03 DIAGNOSIS — K8689 Other specified diseases of pancreas: Secondary | ICD-10-CM

## 2017-11-03 MED ORDER — IOPAMIDOL (ISOVUE-300) INJECTION 61%: 100.0000 mL | Freq: Once | INTRAVENOUS | Status: DC | PRN

## 2017-11-03 NOTE — Telephone Encounter (Signed)
EUSERCP scheduled, pt instructed and medications reviewed.  Patient instructions mailed to home.  Patient to call with any questions or concerns.  

## 2017-11-03 NOTE — Telephone Encounter (Signed)
Elizabeth Kidd.  I can move some less urgent cases out a bit to make room for her next Thursday (Feb 21st). If you can let her know, Chong Sicilian will work on the scheduling. Imaging is so clear that I think it would be safe to begin referral process to medical oncology as well (will leave that to you so we don't confuse things). Thanks   Patty, This is the woman that needs upper EUS and ERCP for pancreatic mass, jaundice. Thursday Feb 21st.  Ok to move those two patients out till March 7th as we discussed.  Thanks  DJ

## 2017-11-03 NOTE — Telephone Encounter (Signed)
Spoke with pt and she is aware of bile duct blockage and will come in for OV with Dr. Henrene Pastor 11/07/17@2 :15pm. Pt aware of appt.

## 2017-11-03 NOTE — Telephone Encounter (Signed)
Vaughan Basta, Please let the patient know that she has a blockage of her bile duct (thus the jaundice) coming from her pancreas. Work her on to my schedule (already full) Tuesday the 19 th at 11:15 am. I will review things in detail with her in person. Thank you.  Linna Hoff, Do you have any availability for EUS with bx and/or ERCP your next 2 Thursdays? Thank you. Jenny Reichmann

## 2017-11-03 NOTE — Telephone Encounter (Signed)
Called by radiology - Dr. 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  Based upon what I can tell from chart it is not medically urgent to contact patient with this news but she will need to undergo further GI evaluation (ERCP/EUS?)

## 2017-11-06 NOTE — Telephone Encounter (Signed)
Thank you all 

## 2017-11-07 ENCOUNTER — Encounter: Payer: Self-pay | Admitting: Internal Medicine

## 2017-11-07 ENCOUNTER — Ambulatory Visit: Payer: Medicare Other | Admitting: Internal Medicine

## 2017-11-07 VITALS — BP 95/56 | HR 74 | Ht 59.0 in | Wt 106.0 lb

## 2017-11-07 DIAGNOSIS — K831 Obstruction of bile duct: Secondary | ICD-10-CM | POA: Diagnosis not present

## 2017-11-07 DIAGNOSIS — C259 Malignant neoplasm of pancreas, unspecified: Secondary | ICD-10-CM

## 2017-11-07 DIAGNOSIS — R17 Unspecified jaundice: Secondary | ICD-10-CM

## 2017-11-07 NOTE — Progress Notes (Signed)
HISTORY OF PRESENT ILLNESS:  Elizabeth Kidd is a 72 y.o. female who was evaluated one week ago today with abdominal discomfort and Hemoccult-positive stool. She was found to be jaundiced on physical exam. She subsequently underwent laboratories which revealed abnormal liver tests with AST 84, ALT 81, alkaline phosphatase 524, total bilirubin 9.4. Glucose 121. Hemoglobin 11.5. INR 1.2. Contrast-enhanced CT scan of the abdomen and pelvis was performed 11/03/2017. The patient was found to have a large aggressive appearing mass involving the head of the pancreas with infiltration into surrounding soft tissues with evidence of vascular involvement and biliary obstruction. Mass effect on the descending duodenum is well. Numerous bilateral pulmonary nodules felt to possibly represent metastatic disease. This follow-up appointment was arranged. She is accompanied by her friend. Patient has had no change in her symptom complex since her visit last week  REVIEW OF SYSTEMS:  All non-GI ROS unchanged from one week ago  Past Medical History:  Diagnosis Date  . Complication of anesthesia   . IBS (irritable bowel syndrome)   . Osteoarthritis   . Osteopenia   . PONV (postoperative nausea and vomiting)   . Raynaud's disease     Past Surgical History:  Procedure Laterality Date  . BREAST LUMPECTOMY     fibroid tumor benign 1987  . DILATION AND CURETTAGE OF UTERUS      Social History Elizabeth Kidd  reports that  has never smoked. she has never used smokeless tobacco. She reports that she does not drink alcohol or use drugs.  family history includes Breast cancer in her mother.  No Known Allergies     PHYSICAL EXAMINATION: Vital signs: BP (!) 95/56   Pulse 74   Ht 4\' 11"  (1.499 m)   Wt 106 lb (48.1 kg)   BMI 21.41 kg/m  General: Thin, no acute distress, obviously jaundiced HEENT: Sclerae are anicteric Abdomen: Not reexamined Psychiatric: alert and oriented x3.  Cooperative  ASSESSMENT:  #1. Advanced pancreatic cancer with biliary obstruction #2. Question metastatic disease to the lungs  PLAN:  #1. The patient has been set up for endoscopic ultrasound and ERCP with stent placement with Dr. Ardis Hughs this Thursday, February 21. I reviewed with the patient the nature of endoscopic ultrasound with biopsy as well as ERCP with stent placement. I discussed that occasionally biopsies are non-diagnostic despite having cancer. I discussed potential limitations, particularly with ERCP, should there be significant duodenal distortion. I discussed the potential need for percutaneous drainage.The nature of the procedure, as well as the risks, benefits, and alternatives were carefully and thoroughly reviewed with the patient. Ample time for discussion and questions allowed. The patient understood, was satisfied, and agreed to proceed. I also recommended that she pack a bag should she need to stay in the hospital overnight post procedure. #2. Referral to medical oncologist  30 minutes were spent face-to-face with the patient. Near the entire time was use for counseling regarding her newly diagnosed pancreatic mass, likely adenocarcinoma, its workup, management and implications. Multiple questions on the part of the patient and her friend answered to their satisfaction. I also provided them with a detailed diagram of the relevant anatomy.  A copy of this note has been sent to Dr. Reynaldo Minium Dr. Ardis Hughs

## 2017-11-07 NOTE — Patient Instructions (Signed)
You will be contacted by oncology to schedule an appointment

## 2017-11-07 NOTE — H&P (View-Only) (Signed)
HISTORY OF PRESENT ILLNESS:  Elizabeth Kidd is a 72 y.o. female who was evaluated one week ago today with abdominal discomfort and Hemoccult-positive stool. She was found to be jaundiced on physical exam. She subsequently underwent laboratories which revealed abnormal liver tests with AST 84, ALT 81, alkaline phosphatase 524, total bilirubin 9.4. Glucose 121. Hemoglobin 11.5. INR 1.2. Contrast-enhanced CT scan of the abdomen and pelvis was performed 11/03/2017. The patient was found to have a large aggressive appearing mass involving the head of the pancreas with infiltration into surrounding soft tissues with evidence of vascular involvement and biliary obstruction. Mass effect on the descending duodenum is well. Numerous bilateral pulmonary nodules felt to possibly represent metastatic disease. This follow-up appointment was arranged. She is accompanied by her friend. Patient has had no change in her symptom complex since her visit last week  REVIEW OF SYSTEMS:  All non-GI ROS unchanged from one week ago  Past Medical History:  Diagnosis Date  . Complication of anesthesia   . IBS (irritable bowel syndrome)   . Osteoarthritis   . Osteopenia   . PONV (postoperative nausea and vomiting)   . Raynaud's disease     Past Surgical History:  Procedure Laterality Date  . BREAST LUMPECTOMY     fibroid tumor benign 1987  . DILATION AND CURETTAGE OF UTERUS      Social History Elizabeth Kidd  reports that  has never smoked. she has never used smokeless tobacco. She reports that she does not drink alcohol or use drugs.  family history includes Breast cancer in her mother.  No Known Allergies     PHYSICAL EXAMINATION: Vital signs: BP (!) 95/56   Pulse 74   Ht 4\' 11"  (1.499 m)   Wt 106 lb (48.1 kg)   BMI 21.41 kg/m  General: Thin, no acute distress, obviously jaundiced HEENT: Sclerae are anicteric Abdomen: Not reexamined Psychiatric: alert and oriented x3.  Cooperative  ASSESSMENT:  #1. Advanced pancreatic cancer with biliary obstruction #2. Question metastatic disease to the lungs  PLAN:  #1. The patient has been set up for endoscopic ultrasound and ERCP with stent placement with Dr. Ardis Hughs this Thursday, February 21. I reviewed with the patient the nature of endoscopic ultrasound with biopsy as well as ERCP with stent placement. I discussed that occasionally biopsies are non-diagnostic despite having cancer. I discussed potential limitations, particularly with ERCP, should there be significant duodenal distortion. I discussed the potential need for percutaneous drainage.The nature of the procedure, as well as the risks, benefits, and alternatives were carefully and thoroughly reviewed with the patient. Ample time for discussion and questions allowed. The patient understood, was satisfied, and agreed to proceed. I also recommended that she pack a bag should she need to stay in the hospital overnight post procedure. #2. Referral to medical oncologist  30 minutes were spent face-to-face with the patient. Near the entire time was use for counseling regarding her newly diagnosed pancreatic mass, likely adenocarcinoma, its workup, management and implications. Multiple questions on the part of the patient and her friend answered to their satisfaction. I also provided them with a detailed diagram of the relevant anatomy.  A copy of this note has been sent to Dr. Reynaldo Minium Dr. Ardis Hughs

## 2017-11-09 ENCOUNTER — Inpatient Hospital Stay: Admission: RE | Admit: 2017-11-09 | Payer: Medicare Other | Source: Ambulatory Visit

## 2017-11-09 ENCOUNTER — Encounter (HOSPITAL_COMMUNITY): Payer: Self-pay

## 2017-11-09 ENCOUNTER — Inpatient Hospital Stay (HOSPITAL_COMMUNITY)
Admission: AD | Admit: 2017-11-09 | Discharge: 2017-11-12 | DRG: 435 | Disposition: A | Discharge status: Home Health | Payer: Medicare Other | Source: Ambulatory Visit | Attending: Internal Medicine | Admitting: Internal Medicine

## 2017-11-09 ENCOUNTER — Ambulatory Visit (HOSPITAL_COMMUNITY): Payer: Medicare Other | Admitting: Anesthesiology

## 2017-11-09 ENCOUNTER — Other Ambulatory Visit: Payer: Self-pay

## 2017-11-09 ENCOUNTER — Ambulatory Visit (HOSPITAL_COMMUNITY): Payer: Medicare Other

## 2017-11-09 ENCOUNTER — Encounter (HOSPITAL_COMMUNITY)
Admission: AD | Disposition: A | Discharge status: Home Health | Payer: Self-pay | Source: Ambulatory Visit | Attending: Internal Medicine

## 2017-11-09 DIAGNOSIS — C801 Malignant (primary) neoplasm, unspecified: Secondary | ICD-10-CM

## 2017-11-09 DIAGNOSIS — K831 Obstruction of bile duct: Secondary | ICD-10-CM | POA: Diagnosis present

## 2017-11-09 DIAGNOSIS — I7300 Raynaud's syndrome without gangrene: Secondary | ICD-10-CM | POA: Diagnosis present

## 2017-11-09 DIAGNOSIS — D869 Sarcoidosis, unspecified: Secondary | ICD-10-CM | POA: Diagnosis present

## 2017-11-09 DIAGNOSIS — C259 Malignant neoplasm of pancreas, unspecified: Secondary | ICD-10-CM | POA: Diagnosis present

## 2017-11-09 DIAGNOSIS — K8689 Other specified diseases of pancreas: Secondary | ICD-10-CM | POA: Diagnosis not present

## 2017-11-09 DIAGNOSIS — R1011 Right upper quadrant pain: Secondary | ICD-10-CM | POA: Diagnosis not present

## 2017-11-09 DIAGNOSIS — D539 Nutritional anemia, unspecified: Secondary | ICD-10-CM | POA: Diagnosis present

## 2017-11-09 DIAGNOSIS — Z6820 Body mass index (BMI) 20.0-20.9, adult: Secondary | ICD-10-CM | POA: Diagnosis not present

## 2017-11-09 DIAGNOSIS — G8929 Other chronic pain: Secondary | ICD-10-CM | POA: Diagnosis not present

## 2017-11-09 DIAGNOSIS — E43 Unspecified severe protein-calorie malnutrition: Secondary | ICD-10-CM | POA: Diagnosis present

## 2017-11-09 DIAGNOSIS — K869 Disease of pancreas, unspecified: Secondary | ICD-10-CM

## 2017-11-09 HISTORY — DX: Adverse effect of unspecified anesthetic, initial encounter: T41.45XA

## 2017-11-09 HISTORY — DX: Other specified postprocedural states: R11.2

## 2017-11-09 HISTORY — PX: ENDOSCOPIC RETROGRADE CHOLANGIOPANCREATOGRAPHY (ERCP) WITH PROPOFOL: SHX5810

## 2017-11-09 HISTORY — DX: Other complications of anesthesia, initial encounter: T88.59XA

## 2017-11-09 HISTORY — PX: EUS: SHX5427

## 2017-11-09 HISTORY — DX: Other specified postprocedural states: Z98.890

## 2017-11-09 LAB — HEPATIC FUNCTION PANEL
ALBUMIN: 3.7 g/dL (ref 3.5–5.0)
ALT: 112 U/L — ABNORMAL HIGH (ref 14–54)
AST: 138 U/L — ABNORMAL HIGH (ref 15–41)
Alkaline Phosphatase: 404 U/L — ABNORMAL HIGH (ref 38–126)
BILIRUBIN DIRECT: 8.2 mg/dL — AB (ref 0.1–0.5)
BILIRUBIN TOTAL: 14.8 mg/dL — AB (ref 0.3–1.2)
Indirect Bilirubin: 6.6 mg/dL — ABNORMAL HIGH (ref 0.3–0.9)
Total Protein: 7.9 g/dL (ref 6.5–8.1)

## 2017-11-09 SURGERY — UPPER ENDOSCOPIC ULTRASOUND (EUS) LINEAR
Anesthesia: General

## 2017-11-09 MED ORDER — SODIUM CHLORIDE 0.9% FLUSH: 3.0000 mL | INTRAVENOUS | Status: DC | PRN

## 2017-11-09 MED ORDER — PIPERACILLIN-TAZOBACTAM 3.375 G IVPB
3.3750 g | INTRAVENOUS | Status: AC
  Administered 2017-11-10: 3.375 g via INTRAVENOUS

## 2017-11-09 MED ORDER — GLUCAGON HCL RDNA (DIAGNOSTIC) 1 MG IJ SOLR
INTRAMUSCULAR | Status: AC
  Filled 2017-11-09: qty 1

## 2017-11-09 MED ORDER — ONDANSETRON HCL 4 MG PO TABS: 4.0000 mg | ORAL_TABLET | Freq: Four times a day (QID) | ORAL | Status: DC | PRN

## 2017-11-09 MED ORDER — SODIUM CHLORIDE 0.9% FLUSH
3.0000 mL | Freq: Two times a day (BID) | INTRAVENOUS | Status: DC
  Administered 2017-11-09 – 2017-11-12 (×5): 3 mL via INTRAVENOUS

## 2017-11-09 MED ORDER — FENTANYL CITRATE (PF) 100 MCG/2ML IJ SOLN
INTRAMUSCULAR | Status: AC
  Filled 2017-11-09: qty 2

## 2017-11-09 MED ORDER — ONDANSETRON HCL 4 MG/2ML IJ SOLN
4.0000 mg | Freq: Once | INTRAMUSCULAR | Status: AC
  Administered 2017-11-09: 4 mg via INTRAVENOUS

## 2017-11-09 MED ORDER — DEXAMETHASONE SODIUM PHOSPHATE 10 MG/ML IJ SOLN
INTRAMUSCULAR | Status: DC | PRN
  Administered 2017-11-09: 8 mg via INTRAVENOUS

## 2017-11-09 MED ORDER — SODIUM CHLORIDE 0.9 % IV SOLN: INTRAVENOUS | Status: DC

## 2017-11-09 MED ORDER — FENTANYL CITRATE (PF) 100 MCG/2ML IJ SOLN
INTRAMUSCULAR | Status: DC | PRN
  Administered 2017-11-09: 50 ug via INTRAVENOUS
  Administered 2017-11-09: 100 ug via INTRAVENOUS
  Administered 2017-11-09: 50 ug via INTRAVENOUS

## 2017-11-09 MED ORDER — PHENYLEPHRINE 40 MCG/ML (10ML) SYRINGE FOR IV PUSH (FOR BLOOD PRESSURE SUPPORT)
PREFILLED_SYRINGE | INTRAVENOUS | Status: DC | PRN
  Administered 2017-11-09: 40 ug via INTRAVENOUS

## 2017-11-09 MED ORDER — PROPOFOL 10 MG/ML IV BOLUS
INTRAVENOUS | Status: AC
  Filled 2017-11-09: qty 20

## 2017-11-09 MED ORDER — CIPROFLOXACIN IN D5W 400 MG/200ML IV SOLN
INTRAVENOUS | Status: AC
  Filled 2017-11-09: qty 200

## 2017-11-09 MED ORDER — LACTATED RINGERS IV SOLN
INTRAVENOUS | Status: DC | PRN
  Administered 2017-11-09 (×2): via INTRAVENOUS

## 2017-11-09 MED ORDER — SODIUM CHLORIDE 0.9 % IV SOLN: 250.0000 mL | INTRAVENOUS | Status: DC | PRN

## 2017-11-09 MED ORDER — ROCURONIUM BROMIDE 10 MG/ML (PF) SYRINGE
PREFILLED_SYRINGE | INTRAVENOUS | Status: DC | PRN
  Administered 2017-11-09: 10 mg via INTRAVENOUS
  Administered 2017-11-09: 40 mg via INTRAVENOUS

## 2017-11-09 MED ORDER — INDOMETHACIN 50 MG RE SUPP
RECTAL | Status: DC | PRN
  Administered 2017-11-09: 100 mg via RECTAL

## 2017-11-09 MED ORDER — ONDANSETRON HCL 4 MG/2ML IJ SOLN
INTRAMUSCULAR | Status: DC | PRN
  Administered 2017-11-09: 4 mg via INTRAVENOUS

## 2017-11-09 MED ORDER — FENTANYL CITRATE (PF) 100 MCG/2ML IJ SOLN: 25.0000 ug | INTRAMUSCULAR | Status: DC | PRN

## 2017-11-09 MED ORDER — ONDANSETRON HCL 4 MG/2ML IJ SOLN
4.0000 mg | Freq: Four times a day (QID) | INTRAMUSCULAR | Status: DC | PRN
  Administered 2017-11-10 – 2017-11-12 (×4): 4 mg via INTRAVENOUS
  Filled 2017-11-09 (×4): qty 2

## 2017-11-09 MED ORDER — INDOMETHACIN 50 MG RE SUPP
RECTAL | Status: AC
  Filled 2017-11-09: qty 2

## 2017-11-09 MED ORDER — CIPROFLOXACIN IN D5W 400 MG/200ML IV SOLN
INTRAVENOUS | Status: DC | PRN
  Administered 2017-11-09: 400 mg via INTRAVENOUS

## 2017-11-09 MED ORDER — ONDANSETRON HCL 4 MG/2ML IJ SOLN
INTRAMUSCULAR | Status: AC
  Filled 2017-11-09: qty 2

## 2017-11-09 MED ORDER — SUGAMMADEX SODIUM 200 MG/2ML IV SOLN
INTRAVENOUS | Status: DC | PRN
  Administered 2017-11-09: 200 mg via INTRAVENOUS

## 2017-11-09 MED ORDER — PROPOFOL 10 MG/ML IV BOLUS
INTRAVENOUS | Status: DC | PRN
  Administered 2017-11-09: 100 mg via INTRAVENOUS

## 2017-11-09 NOTE — Anesthesia Preprocedure Evaluation (Addendum)
Anesthesia Evaluation  Patient identified by MRN, date of birth, ID band Patient awake    Reviewed: Allergy & Precautions, H&P , NPO status , Patient's Chart, lab work & pertinent test results  History of Anesthesia Complications (+) PONV  Airway Mallampati: III  TM Distance: >3 FB Neck ROM: Full    Dental no notable dental hx. (+) Teeth Intact, Dental Advisory Given   Pulmonary neg pulmonary ROS,    Pulmonary exam normal breath sounds clear to auscultation       Cardiovascular negative cardio ROS   Rhythm:Regular Rate:Normal     Neuro/Psych negative neurological ROS  negative psych ROS   GI/Hepatic negative GI ROS, Neg liver ROS, Pancreatic mass    Endo/Other  negative endocrine ROS  Renal/GU negative Renal ROS  negative genitourinary   Musculoskeletal  (+) Arthritis , Osteoarthritis,    Abdominal   Peds  Hematology negative hematology ROS (+)   Anesthesia Other Findings   Reproductive/Obstetrics negative OB ROS                            Anesthesia Physical Anesthesia Plan  ASA: II  Anesthesia Plan: General   Post-op Pain Management:    Induction: Intravenous  PONV Risk Score and Plan: 4 or greater and Ondansetron, Dexamethasone and Midazolam  Airway Management Planned: Oral ETT  Additional Equipment:   Intra-op Plan:   Post-operative Plan: Extubation in OR  Informed Consent: I have reviewed the patients History and Physical, chart, labs and discussed the procedure including the risks, benefits and alternatives for the proposed anesthesia with the patient or authorized representative who has indicated his/her understanding and acceptance.   Dental advisory given  Plan Discussed with: CRNA  Anesthesia Plan Comments:         Anesthesia Quick Evaluation

## 2017-11-09 NOTE — Op Note (Signed)
[facility] Patient Name: Elizabeth Kidd Procedure Date: 11/09/2017 MRN: [id number] Attending MD: Milus Banister , MD Date of Birth: 07-Apr-1946 CSN: [id number] Age: 72 Admit Type: Outpatient Procedure:                Upper EUS Indications:              Painless jaundice, mass in pancreas on recent CT,                            nodules in lung bases suspicious for metastatic                            disease Providers:                Milus Banister, MD, Cleda Daub, RN, Laurena Spies, Technician Referring MD:             Scarlette Shorts, MD Medicines:                General Anesthesia Complications:            No immediate complications. Estimated blood loss:                            None. Estimated Blood Loss:     Estimated blood loss: none. Procedure:                Pre-Anesthesia Assessment:                           - Prior to the procedure, a History and Physical                            was performed, and patient medications and                            allergies were reviewed. The patient's tolerance of                            previous anesthesia was also reviewed. The risks                            and benefits of the procedure and the sedation                            options and risks were discussed with the patient.                            All questions were answered, and informed consent                            was obtained. Prior Anticoagulants: The patient has                            taken no previous  anticoagulant or antiplatelet                            agents. ASA Grade Assessment: III - A patient with                            severe systemic disease. After reviewing the risks                            and benefits, the patient was deemed in                            satisfactory condition to undergo the procedure.                           After obtaining informed consent, the endoscope was                             passed under direct vision. Throughout the                            procedure, the patient's blood pressure, pulse, and                            oxygen saturations were monitored continuously. The                            [id number] ([id number]) scope was introduced through                            the mouth, and advanced to the second part of                            duodenum. The upper EUS was accomplished without                            difficulty. The patient tolerated the procedure                            well. Scope In: Scope Out: Findings:      Endoscopic Finding :      The examined esophagus was endoscopically normal.      The entire examined stomach was endoscopically normal.      The examined duodenum was endoscopically normal.      Endosonographic Finding :      1. An irregular mass was identified in the pancreatic head. The mass was       hypoechoic and heterogenous. The mass measured 37 mm in maximal       cross-sectional diameter. The endosonographic borders were       poorly-defined but the mass clearly invades the main portal vein, celiac       trunk and SMA on recent CT scan. Fine needle aspiration for cytology was       performed. Color Doppler imaging was utilized prior to needle puncture       to confirm a  lack of significant vascular structures within the needle       path. Three passes were made with the 25 gauge needle using a       transduodenal approach. A cytotechnologist was present to evaluate the       adequacy of the specimen.      2. CBD was dilated (1cm).      3. Main pancreatic duc was slightly dilated 12mm in body.      4. Limited views of the liver, spleen were normal. Impression:               - Irregularly shaped, approximately 3.7cm mass in                            the pancreatic head. Preliminary cytology review is                            positive for malignancy (adenocarcinoma).                            - EUS and CT images show this mass encases, invades                            the portal vein and involves the celiac trunk, SMA                            (non-resectable).                           - Pulmonary nodules on abdominal CT suggest                            metastatic disease. Moderate Sedation:      N/A- Per Anesthesia Care Recommendation:           - ERCP now to attempt biliary stenting,                            decompression Procedure Code(s):        --- Professional ---                           [phone], Esophagogastroduodenoscopy, flexible,                            transoral; with transendoscopic ultrasound-guided                            intramural or transmural fine needle                            aspiration/biopsy(s), (includes endoscopic                            ultrasound examination limited to the esophagus,                            stomach or duodenum, and adjacent structures) Diagnosis Code(s):        ---  Professional ---                           K86.89, Other specified diseases of pancreas                           R93.3, Abnormal findings on diagnostic imaging of                            other parts of digestive tract CPT copyright 2016 [employer]. All rights reserved. The codes documented in this report are preliminary and upon coder review may  be revised to meet current compliance requirements. Milus Banister, MD 11/09/2017 19:14:78 PM This report has been signed electronically. Number of Addenda: 0

## 2017-11-09 NOTE — Anesthesia Procedure Notes (Signed)
Procedure Name: Intubation Date/Time: 11/09/2017 11:50 AM Performed by: Anne Fu, CRNA Pre-anesthesia Checklist: Patient identified, Emergency Drugs available, Suction available, Patient being monitored and Timeout performed Patient Re-evaluated:Patient Re-evaluated prior to induction Oxygen Delivery Method: Circle system utilized Preoxygenation: Pre-oxygenation with 100% oxygen Induction Type: IV induction Ventilation: Mask ventilation without difficulty Laryngoscope Size: Mac and 3 Grade View: Grade I Tube type: Oral Tube size: 7.5 mm Number of attempts: 1 Airway Equipment and Method: Stylet Placement Confirmation: ETT inserted through vocal cords under direct vision,  positive ETCO2 and breath sounds checked- equal and bilateral Secured at: 19 cm Tube secured with: Tape Dental Injury: Teeth and Oropharynx as per pre-operative assessment

## 2017-11-09 NOTE — Op Note (Signed)
[facility] Patient Name: Elizabeth Kidd Procedure Date: 11/09/2017 MRN: [id number] Attending MD: Milus Banister , MD Date of Birth: [birth year] CSN: [id number] Age: 72 Admit Type: Outpatient Procedure:                ERCP Indications:              Obstructive jaundice from non-resectable pancreatic                            adenocarcinoma (EUS just prior to this exam) Providers:                Milus Banister, MD, Cleda Daub, RN, Laurena Spies, Technician Referring MD:             Scarlette Shorts, MD Medicines:                General Anesthesia, Indomethacin 100 mg PR, Cipro                            350 mg IV Complications:            No immediate complications. Estimated blood loss:                            None Estimated Blood Loss:     Estimated blood loss: none. Procedure:                Pre-Anesthesia Assessment:                           - Prior to the procedure, a History and Physical                            was performed, and patient medications and                            allergies were reviewed. The patient's tolerance of                            previous anesthesia was also reviewed. The risks                            and benefits of the procedure and the sedation                            options and risks were discussed with the patient.                            All questions were answered, and informed consent                            was obtained. Prior Anticoagulants: The patient has  taken no previous anticoagulant or antiplatelet                            agents. ASA Grade Assessment: III - A patient with                            severe systemic disease. After reviewing the risks                            and benefits, the patient was deemed in                            satisfactory condition to undergo the procedure.                           After obtaining informed  consent, the scope was                            passed under direct vision. Throughout the                            procedure, the patient's blood pressure, pulse, and                            oxygen saturations were monitored continuously. The                            [id number] scope was introduced through the                            mouth, and used to inject contrast into and used to                            inject contrast into the bile duct. The ERCP was                            technically difficult and complex due to                            challenging cannulation because of abnormal                            anatomy. The patient tolerated the procedure well. Scope In: Scope Out: Findings:      The scout film was normal. The esophagus was successfully intubated       under direct vision. The scope was advanced to a normal major papilla in       the descending duodenum without detailed examination of the pharynx,       larynx and associated structures, and upper GI tract. The upper GI tract       was grossly normal. There was a small periampullary diverticulum that       did not interfere with cannulation attempts. A 44 [employer] over a .035       wire was used to cannulate  the bile duct. The pancreatic duct was       cannulated with the wire and briefly injected with dye during biliary       cannulation attempts. Dye was injected in the biliary tree.       Cholangiogram revealed a non-dilated most distal CBD (normal for 2cm).       There was also a very tight stricture in the region of the common       hepatic duct/cystic duct takeoff. Above the level of the stricture dye       filled the cystic duct and partially filled the gallbladder but dye       never reached more proximal common hepatic duct. I was not able to       advance the wire through the stricture into the more proximal common       hepatic duct. After two wire changes and one catheter change  without       success (60-75 minute total), the procedure was terminated. 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 I was                            unable to advance either a wire or inject dye                            through the stricture and so I suspect it is a very                            tight stricture. Moderate Sedation:      N/A- Per Anesthesia Care Recommendation:           - Admit the patient to hospital ward for ongoing                            care.                           - PTC biliary drain via IR. Procedure Code(s):        --- Professional ---                           [phone], Endoscopic retrograde                            cholangiopancreatography (ERCP); diagnostic,                            including collection of specimen(s) by brushing or                            washing, when performed (separate procedure) Diagnosis Code(s):        --- Professional ---  K83.1, Obstruction of bile duct                           R17, Unspecified jaundice CPT copyright 2016 American Medical Association. All rights reserved. The codes documented in this report are preliminary and upon coder review may  be revised to meet current compliance requirements. Milus Banister, MD 11/09/2017 1:58:45 PM This report has been signed electronically. Number of Addenda: 0

## 2017-11-09 NOTE — Anesthesia Postprocedure Evaluation (Signed)
Anesthesia Post Note  Patient: Elizabeth Kidd  Procedure(s) Performed: UPPER ENDOSCOPIC ULTRASOUND (EUS) LINEAR (N/A ) ENDOSCOPIC RETROGRADE CHOLANGIOPANCREATOGRAPHY (ERCP) WITH PROPOFOL (N/A )     Patient location during evaluation: PACU Anesthesia Type: General Level of consciousness: awake and alert Pain management: pain level controlled Vital Signs Assessment: post-procedure vital signs reviewed and stable Respiratory status: spontaneous breathing, nonlabored ventilation and respiratory function stable Cardiovascular status: blood pressure returned to baseline and stable Postop Assessment: no apparent nausea or vomiting Anesthetic complications: no    Last Vitals:  Vitals:   11/09/17 1440 11/09/17 1450  BP: (!) 149/73 (!) 141/72  Pulse: 65 64  Resp: 11 13  Temp:    SpO2: 98% 97%    Last Pain:  Vitals:   11/09/17 1355  TempSrc: Oral                 Domanik [place],W. EDMOND

## 2017-11-09 NOTE — Discharge Instructions (Signed)
YOU HAD AN ENDOSCOPIC PROCEDURE TODAY: Refer to the procedure report and other information in the discharge instructions given to you for any specific questions about what was found during the examination. If this information does not answer your questions, please call [place] office at [phone] to clarify.  ° °YOU SHOULD EXPECT: Some feelings of bloating in the abdomen. Passage of more gas than usual. Walking can help get rid of the air that was put into your GI tract during the procedure and reduce the bloating. If you had a lower endoscopy (such as a colonoscopy or flexible sigmoidoscopy) you may notice spotting of blood in your stool or on the toilet paper. Some abdominal soreness may be present for a day or two, also. ° °DIET: Your first meal following the procedure should be a light meal and then it is ok to progress to your normal diet. A half-sandwich or bowl of soup is an example of a good first meal. Heavy or fried foods are harder to digest and may make you feel nauseous or bloated. Drink plenty of fluids but you should avoid alcoholic beverages for 24 hours. If you had a esophageal dilation, please see attached instructions for diet.   ° °ACTIVITY: Your care partner should take you home directly after the procedure. You should plan to take it easy, moving slowly for the rest of the day. You can resume normal activity the day after the procedure however YOU SHOULD NOT DRIVE, use power tools, machinery or perform tasks that involve climbing or major physical exertion for 24 hours (because of the sedation medicines used during the test).  ° °SYMPTOMS TO REPORT IMMEDIATELY: °A gastroenterologist can be reached at any hour. Please call [phone]  for any of the following symptoms:  °Following lower endoscopy (colonoscopy, flexible sigmoidoscopy) °Excessive amounts of blood in the stool  °Significant tenderness, worsening of abdominal pains  °Swelling of the abdomen that is new, acute  °Fever of 100° or  higher  °Following upper endoscopy (EGD, EUS, ERCP, esophageal dilation) °Vomiting of blood or coffee ground material  °New, significant abdominal pain  °New, significant chest pain or pain under the shoulder blades  °Painful or persistently difficult swallowing  °New shortness of breath  °Black, tarry-looking or red, bloody stools ° °FOLLOW UP:  °If any biopsies were taken you will be contacted by phone or by letter within the next 1-3 weeks. Call [phone]  if you have not heard about the biopsies in 3 weeks.  °Please also call with any specific questions about appointments or follow up tests. ° °

## 2017-11-09 NOTE — H&P (Signed)
History and Physical    Elizabeth Kidd MRN:[id number] DOB: 11/20/1945 DOA: 11/09/2017  PCP: Aronson, Richard, MD  Patient coming from: Home  Chief Complaint: Yellow  HPI: Elizabeth Kidd is a 72 y.o. female with medical history significant of Raynard's, with recent diagnosis of pancreatic mass comes in today with GI Dr. Jacobs for an EUS and ERCP for stent placement for her painless obstructive jaundice.  Dr. Jacobs was unable to place a stent today as this area was too narrow.  Patient will need IR to do procedure in the morning to provide relief of her obstruction.  She denies any fevers.  She denies any pains.  She has nauseous she is about to get a dose of Zofran.  Prior to today she has been eating and drinking enough liquids.  Patient is currently in the endoscopic lab and will be admitted to MedSurg for further evaluation by IR.   Review of Systems: As per HPI otherwise 10 point review of systems negative.   Past Medical History:  Diagnosis Date  . Complication of anesthesia   . IBS (irritable bowel syndrome)   . Osteoarthritis   . Osteopenia   . PONV (postoperative nausea and vomiting)   . Raynaud's disease     Past Surgical History:  Procedure Laterality Date  . BREAST LUMPECTOMY     fibroid tumor benign 1987  . DILATION AND CURETTAGE OF UTERUS       reports that  has never smoked. she has never used smokeless tobacco. She reports that she does not drink alcohol or use drugs.  No Known Allergies  Family History  Problem Relation Age of Onset  . Breast cancer Mother   . Colon cancer Neg Hx   . Pancreatic cancer Neg Hx   . Stomach cancer Neg Hx   . Rectal cancer Neg Hx     Prior to Admission medications   Medication Sig Start Date End Date Taking? Authorizing Provider  Cholecalciferol (VITAMIN D3 PO) Take 1 capsule by mouth daily.   Yes [provider]  Polyethyl Glycol-Propyl Glycol (SYSTANE OP) Place 1 drop into both eyes 3 (three) times daily as  needed (for dry eyes).   Yes [provider]    Physical Exam: Vitals:   11/09/17 1440 11/09/17 1450 11/09/17 1500 11/09/17 1510  BP: (!) 149/73 (!) 141/72 (!) 151/64 (!) 157/65  Pulse: 65 64 62 66  Resp: 11 13 14 12  Temp:      TempSrc:      SpO2: 98% 97% 100% 98%  Weight:      Height:          Constitutional: NAD, calm, comfortable very jaundiced Vitals:   11/09/17 1440 11/09/17 1450 11/09/17 1500 11/09/17 1510  BP: (!) 149/73 (!) 141/72 (!) 151/64 (!) 157/65  Pulse: 65 64 62 66  Resp: 11 13 14 12  Temp:      TempSrc:      SpO2: 98% 97% 100% 98%  Weight:      Height:       Eyes: PERRL, lids and conjunctivae normal ENMT: Mucous membranes are moist. Posterior pharynx clear of any exudate or lesions.Normal dentition.  Neck: normal, supple, no masses, no thyromegaly Respiratory: clear to auscultation bilaterally, no wheezing, no crackles. Normal respiratory effort. No accessory muscle use.  Cardiovascular: Regular rate and rhythm, no murmurs / rubs / gallops. No extremity edema. 2+ pedal pulses. No carotid bruits.  Abdomen: no tenderness, no masses palpated. No hepatosplenomegaly. Bowel sounds   History and Physical    Elizabeth Kidd MRN:[id number] DOB: 11/20/1945 DOA: 11/09/2017  PCP: Aronson, Richard, MD  Patient coming from: Home  Chief Complaint: Yellow  HPI: Elizabeth Kidd is a 72 y.o. female with medical history significant of Raynard's, with recent diagnosis of pancreatic mass comes in today with GI Dr. Jacobs for an EUS and ERCP for stent placement for her painless obstructive jaundice.  Dr. Jacobs was unable to place a stent today as this area was too narrow.  Patient will need IR to do procedure in the morning to provide relief of her obstruction.  She denies any fevers.  She denies any pains.  She has nauseous she is about to get a dose of Zofran.  Prior to today she has been eating and drinking enough liquids.  Patient is currently in the endoscopic lab and will be admitted to MedSurg for further evaluation by IR.   Review of Systems: As per HPI otherwise 10 point review of systems negative.   Past Medical History:  Diagnosis Date  . Complication of anesthesia   . IBS (irritable bowel syndrome)   . Osteoarthritis   . Osteopenia   . PONV (postoperative nausea and vomiting)   . Raynaud's disease     Past Surgical History:  Procedure Laterality Date  . BREAST LUMPECTOMY     fibroid tumor benign 1987  . DILATION AND CURETTAGE OF UTERUS       reports that  has never smoked. she has never used smokeless tobacco. She reports that she does not drink alcohol or use drugs.  No Known Allergies  Family History  Problem Relation Age of Onset  . Breast cancer Mother   . Colon cancer Neg Hx   . Pancreatic cancer Neg Hx   . Stomach cancer Neg Hx   . Rectal cancer Neg Hx     Prior to Admission medications   Medication Sig Start Date End Date Taking? Authorizing Provider  Cholecalciferol (VITAMIN D3 PO) Take 1 capsule by mouth daily.   Yes [provider]  Polyethyl Glycol-Propyl Glycol (SYSTANE OP) Place 1 drop into both eyes 3 (three) times daily as  needed (for dry eyes).   Yes [provider]    Physical Exam: Vitals:   11/09/17 1440 11/09/17 1450 11/09/17 1500 11/09/17 1510  BP: (!) 149/73 (!) 141/72 (!) 151/64 (!) 157/65  Pulse: 65 64 62 66  Resp: 11 13 14 12  Temp:      TempSrc:      SpO2: 98% 97% 100% 98%  Weight:      Height:          Constitutional: NAD, calm, comfortable very jaundiced Vitals:   11/09/17 1440 11/09/17 1450 11/09/17 1500 11/09/17 1510  BP: (!) 149/73 (!) 141/72 (!) 151/64 (!) 157/65  Pulse: 65 64 62 66  Resp: 11 13 14 12  Temp:      TempSrc:      SpO2: 98% 97% 100% 98%  Weight:      Height:       Eyes: PERRL, lids and conjunctivae normal ENMT: Mucous membranes are moist. Posterior pharynx clear of any exudate or lesions.Normal dentition.  Neck: normal, supple, no masses, no thyromegaly Respiratory: clear to auscultation bilaterally, no wheezing, no crackles. Normal respiratory effort. No accessory muscle use.  Cardiovascular: Regular rate and rhythm, no murmurs / rubs / gallops. No extremity edema. 2+ pedal pulses. No carotid bruits.  Abdomen: no tenderness, no masses palpated. No hepatosplenomegaly. Bowel sounds   History and Physical    Elizabeth Kidd MRN:[id number] DOB: 11/20/1945 DOA: 11/09/2017  PCP: Aronson, Richard, MD  Patient coming from: Home  Chief Complaint: Yellow  HPI: Elizabeth Kidd is a 72 y.o. female with medical history significant of Raynard's, with recent diagnosis of pancreatic mass comes in today with GI Dr. Jacobs for an EUS and ERCP for stent placement for her painless obstructive jaundice.  Dr. Jacobs was unable to place a stent today as this area was too narrow.  Patient will need IR to do procedure in the morning to provide relief of her obstruction.  She denies any fevers.  She denies any pains.  She has nauseous she is about to get a dose of Zofran.  Prior to today she has been eating and drinking enough liquids.  Patient is currently in the endoscopic lab and will be admitted to MedSurg for further evaluation by IR.   Review of Systems: As per HPI otherwise 10 point review of systems negative.   Past Medical History:  Diagnosis Date  . Complication of anesthesia   . IBS (irritable bowel syndrome)   . Osteoarthritis   . Osteopenia   . PONV (postoperative nausea and vomiting)   . Raynaud's disease     Past Surgical History:  Procedure Laterality Date  . BREAST LUMPECTOMY     fibroid tumor benign 1987  . DILATION AND CURETTAGE OF UTERUS       reports that  has never smoked. she has never used smokeless tobacco. She reports that she does not drink alcohol or use drugs.  No Known Allergies  Family History  Problem Relation Age of Onset  . Breast cancer Mother   . Colon cancer Neg Hx   . Pancreatic cancer Neg Hx   . Stomach cancer Neg Hx   . Rectal cancer Neg Hx     Prior to Admission medications   Medication Sig Start Date End Date Taking? Authorizing Provider  Cholecalciferol (VITAMIN D3 PO) Take 1 capsule by mouth daily.   Yes [provider]  Polyethyl Glycol-Propyl Glycol (SYSTANE OP) Place 1 drop into both eyes 3 (three) times daily as  needed (for dry eyes).   Yes [provider]    Physical Exam: Vitals:   11/09/17 1440 11/09/17 1450 11/09/17 1500 11/09/17 1510  BP: (!) 149/73 (!) 141/72 (!) 151/64 (!) 157/65  Pulse: 65 64 62 66  Resp: 11 13 14 12  Temp:      TempSrc:      SpO2: 98% 97% 100% 98%  Weight:      Height:          Constitutional: NAD, calm, comfortable very jaundiced Vitals:   11/09/17 1440 11/09/17 1450 11/09/17 1500 11/09/17 1510  BP: (!) 149/73 (!) 141/72 (!) 151/64 (!) 157/65  Pulse: 65 64 62 66  Resp: 11 13 14 12  Temp:      TempSrc:      SpO2: 98% 97% 100% 98%  Weight:      Height:       Eyes: PERRL, lids and conjunctivae normal ENMT: Mucous membranes are moist. Posterior pharynx clear of any exudate or lesions.Normal dentition.  Neck: normal, supple, no masses, no thyromegaly Respiratory: clear to auscultation bilaterally, no wheezing, no crackles. Normal respiratory effort. No accessory muscle use.  Cardiovascular: Regular rate and rhythm, no murmurs / rubs / gallops. No extremity edema. 2+ pedal pulses. No carotid bruits.  Abdomen: no tenderness, no masses palpated. No hepatosplenomegaly. Bowel sounds

## 2017-11-09 NOTE — Interval H&P Note (Signed)
History and Physical Interval Note:  11/09/2017 11:13 AM  Elizabeth Kidd  has presented today for surgery, with the diagnosis of pancreatic mass  The various methods of treatment have been discussed with the patient and family. After consideration of risks, benefits and other options for treatment, the patient has consented to  Procedure(s): UPPER ENDOSCOPIC ULTRASOUND (EUS) LINEAR (N/A) ENDOSCOPIC RETROGRADE CHOLANGIOPANCREATOGRAPHY (ERCP) WITH PROPOFOL (N/A) as a surgical intervention .  The patient's history has been reviewed, patient examined, no change in status, stable for surgery.  I have reviewed the patient's chart and labs.  Questions were answered to the patient's satisfaction.     Milus Banister

## 2017-11-09 NOTE — Progress Notes (Signed)
Chief Complaint: Patient was seen in consultation today for  at the request of Dr. Oretha Caprice  Referring Physician(s): Dr. Ardis Hughs  Supervising Physician: Marybelle Killings  Patient Status: [facility] - In-pt  History of Present Illness: Elizabeth Kidd is a 72 y.o. female being worked up for pancreatic mass with obstructive jaundice. She came in for outpt ERCP and biliary stent placement, but they were not successful in placing stent. She is now being admitted and IR is asked to do PTC with biliary drain. Pt seen in Endo recovery. She is awake and alert but within a few hours of sedation. She has no family that lives close. Chart, imaging, meds, labs reviewed.   Past Medical History:  Diagnosis Date  . Complication of anesthesia   . IBS (irritable bowel syndrome)   . Osteoarthritis   . Osteopenia   . PONV (postoperative nausea and vomiting)   . Raynaud's disease     Past Surgical History:  Procedure Laterality Date  . BREAST LUMPECTOMY     fibroid tumor benign 1987  . DILATION AND CURETTAGE OF UTERUS      Allergies: Patient has no known allergies.  Medications:  Current Facility-Administered Medications:  .  0.9 %  sodium chloride infusion, 250 mL, Intravenous, PRN, Derrill Kay A, MD .  fentaNYL (SUBLIMAZE) injection 25-50 mcg, 25-50 mcg, Intravenous, Q5 min PRN, Roderic Palau, MD .  ondansetron [facility]) tablet 4 mg, 4 mg, Oral, Q6H PRN **OR** ondansetron (ZOFRAN) injection 4 mg, 4 mg, Intravenous, Q6H PRN, Derrill Kay A, MD .  sodium chloride flush (NS) 0.9 % injection 3 mL, 3 mL, Intravenous, Q12H, David, Rachal A, MD .  sodium chloride flush (NS) 0.9 % injection 3 mL, 3 mL, Intravenous, PRN, Phillips Grout, MD    Family History  Problem Relation Age of Onset  . Breast cancer Mother   . Colon cancer Neg Hx   . Pancreatic cancer Neg Hx   . Stomach cancer Neg Hx   . Rectal cancer Neg Hx     Social History   Socioeconomic History  . Marital status:  Widowed    Spouse name: None  . Number of children: None  . Years of education: None  . Highest education level: None  Social Needs  . Financial resource strain: None  . Food insecurity - worry: None  . Food insecurity - inability: None  . Transportation needs - medical: None  . Transportation needs - non-medical: None  Occupational History  . None  Tobacco Use  . Smoking status: Never Smoker  . Smokeless tobacco: Never Used  Substance and Sexual Activity  . Alcohol use: No  . Drug use: No  . Sexual activity: None  Other Topics Concern  . None  Social History Narrative  . None     Review of Systems: A 12 point ROS discussed and pertinent positives are indicated in the HPI above.  All other systems are negative.  Review of Systems  Vital Signs: BP 139/64   Pulse 68   Temp 97.7 F (36.5 C) (Oral)   Resp 18   Ht '4\' 11"'$  (1.499 m)   Wt 106 lb (48.1 kg)   SpO2 97%   BMI 21.41 kg/m   Physical Exam  Constitutional: She is oriented to person, place, and time. She appears well-developed. No distress.  HENT:  Head: Normocephalic.  Mouth/Throat: Oropharynx is clear and moist.  Eyes: Scleral icterus is present.  Neck: Normal range of motion. No JVD present.  No tracheal deviation present.  Cardiovascular: Normal rate, regular rhythm and normal heart sounds.  Pulmonary/Chest: Effort normal and breath sounds normal.  Abdominal: Soft. She exhibits no mass. There is no tenderness. There is no guarding.  Neurological: She is alert and oriented to person, place, and time.  Skin: Skin is warm and dry.  Gross jaundice  Psychiatric: She has a normal mood and affect.      Imaging: Ct Abdomen Pelvis W Contrast  Result Date: 11/03/2017 CLINICAL DATA:  Abdominal pain, weight loss and jaundice. EXAM: CT ABDOMEN AND PELVIS WITH CONTRAST TECHNIQUE: Multidetector CT imaging of the abdomen and pelvis was performed using the standard protocol following bolus administration of  intravenous contrast. CONTRAST:  100 cc of Isovue-300 COMPARISON:  None. FINDINGS: Lower chest: No pleural effusion. Numerous pulmonary nodules are identified throughout both lungs. Index nodule in the right lower lobe measures 1 cm, image 4 of series 3. Index nodule in the left lower lobe measures 7 mm, image 9 of series 3. Hepatobiliary: Moderate to marked intrahepatic bile duct dilatation. No focal liver abnormality identified to suggest metastatic disease. The proximal common bile duct measures 1.4 cm, image 25 of series 2. There is an abrupt cut off at the level of the mid common bile duct by pancreas mass. Pancreas: There is a large mass centered around the expected location of the head of pancreas which measures 3.8 by 2.6 by 4.1 cm. There is surrounding soft tissue infiltration which encases and nearly completely occludes the portal vein and portal venous confluence. Tumor extends into the aortocaval region and encases both the celiac and superior mesenteric arteries. Atrophy of the body and tail of pancreas noted. Spleen: The spleen is measures 6.9 x 7.8 x 13.3 cm (volume = 370 cm^3) Adrenals/Urinary Tract: Normal appearance of the adrenal glands. No kidney mass or hydronephrosis. Small left lower pole cyst measures 8 mm. Urinary bladder appears normal. Stomach/Bowel: The stomach appears normal. Tumor has mass effect upon the medial wall of the descending duodenum. Cannot rule out invasion. No gastric outlet obstruction. No abnormal small bowel dilatation identified. The appendix is visualized and is normal. Normal appearance of the colon. Vascular/Lymphatic: Mild aortic atherosclerosis. Tumor encasement of the celiac and superior mesenteric arteries noted. There is also encasement and significant narrowing of the portal venous confluence and extrahepatic portal vein. Dilated intrahepatic portal vein is non thrombosed. Splenic vein remains patent. Small distal esophageal varices and multiple upper abdominal  collateral vessels identified. Soft tissue infiltration within the proximal aortocaval region measures 1.4 by 2.9 cm. No pelvic or inguinal adenopathy. Reproductive: The uterus appears atrophic. No adnexal mass identified. Other: No ascites.  No peritoneal nodularity identified. Musculoskeletal: Spondylosis identified within the lumbar spine. There is an anterolisthesis of L4 on L5. No suspicious bone lesions. IMPRESSION: 1. Large, aggressive appearing mass involving the head of pancreas is identified with infiltration into the surrounding soft tissues. There is evidence of vascular involvement the portal venous confluence, extrahepatic portal vein, celiac trunk and superior mesenteric artery. Mass completely occludes the common bile duct resulting in marked intrahepatic biliary dilatation. There is also mass effect upon the medial wall of the descending duodenum. Cannot rule out invasion of the duodenum. 2. Numerous bilateral lower lobe pulmonary nodules compatible with metastatic disease. Electronically Signed   By: Kerby Moors M.D.   On: 11/03/2017 10:46   Dg Ercp Biliary & Pancreatic Ducts  Result Date: 11/09/2017 CLINICAL DATA:  Pancreatic mass.  Attempted ERCP. EXAM: ERCP TECHNIQUE: Multiple  spot images obtained with the fluoroscopic device and submitted for interpretation post-procedure. COMPARISON:  CT abdomen and pelvis - 11/03/2017 FLUOROSCOPY TIME:  13 minutes, 43 seconds FINDINGS: 3 spot intraoperative fluoroscopic images of the right upper abdominal quadrant during ERCP are provided for review. Initial image demonstrates an ERCP probe overlying the right upper abdominal quadrant. There is passage of a wire through the CBD and into the origin of the cystic duct with faint contrast opacification. Subsequent images demonstrate faint opacification the distal aspect of the CBD as well as the pancreatic duct. There is no significant opacification intrahepatic biliary system. No are discrete filling  defects within the opacified portions of the biliary system. IMPRESSION: ERCP as above. These images were submitted for radiologic interpretation only. Please see the procedural report for the amount of contrast and the fluoroscopy time utilized. Electronically Signed   By: Sandi Mariscal M.D.   On: 11/09/2017 14:34    Labs:  CBC: Recent Labs    10/31/17 1443  WBC 5.6  HGB 11.5*  HCT 34.2*  PLT 170.0    COAGS: Recent Labs    10/31/17 1443  INR 1.2*    BMP: Recent Labs    10/31/17 1443  NA 139  K 4.3  CL 102  CO2 30  GLUCOSE 121*  BUN 15  CALCIUM 9.5  CREATININE 0.83    LIVER FUNCTION TESTS: Recent Labs    10/31/17 1443 11/09/17 1134  BILITOT 9.4* 14.8*  AST 84* 138*  ALT 81* 112*  ALKPHOS 524* 404*  PROT 7.2 7.9  ALBUMIN 3.9 3.7    TUMOR MARKERS: No results for input(s): AFPTM, CEA, CA199, CHROMGRNA in the last 8760 hours.  Assessment and Plan: Pancreatic mass Obstructive jaundice No sepsis/cholangitis evident. Pt being admitted. Imaging and case reviewed with/by Dr. Barbie Banner Plan for [facility] and biliary drain tomorrow. Risks and benefits of PTC were discussed with the patient including, but not limited to bleeding, infection which may lead to sepsis or even death and damage to adjacent structures.  This interventional procedure involves the use of X-rays and because of the nature of the planned procedure, it is possible that we will have prolonged use of X-ray fluoroscopy.  Potential radiation risks to you include (but are not limited to) the following: - A slightly elevated risk for cancer  several years later in life. This risk is typically less than 0.5% percent. This risk is low in comparison to the normal incidence of human cancer, which is 33% for women and 50% for men according to the [place]. - Radiation induced injury can include skin redness, resembling a rash, tissue breakdown / ulcers and hair loss (which can be temporary or  permanent).   The likelihood of either of these occurring depends on the difficulty of the procedure and whether you are sensitive to radiation due to previous procedures, disease, or genetic conditions.   IF your procedure requires a prolonged use of radiation, you will be notified and given written instructions for further action.  It is your responsibility to monitor the irradiated area for the 2 weeks following the procedure and to notify your physician if you are concerned that you have suffered a radiation induced injury.    All of the patient's questions were answered, patient is agreeable to proceed.  Consent will be deferred until tomorrow as the pt has just recently been sedated. NPO p MN No anticoagulation please.     Thank you for this interesting consult.  I  greatly enjoyed meeting Elizabeth Kidd and look forward to participating in their care.  A copy of this report was sent to the requesting provider on this date.  Electronically Signed: Ascencion Dike, PA-C 11/09/2017, 4:26 PM   I spent a total of 25 minutes in face to face in clinical consultation, greater than 50% of which was counseling/coordinating care for biliary obstruction/PTC

## 2017-11-09 NOTE — Transfer of Care (Signed)
Immediate Anesthesia Transfer of Care Note  Patient: Wiley Flicker  Procedure(s) Performed: UPPER ENDOSCOPIC ULTRASOUND (EUS) LINEAR (N/A ) ENDOSCOPIC RETROGRADE CHOLANGIOPANCREATOGRAPHY (ERCP) WITH PROPOFOL (N/A )  Patient Location: PACU  Anesthesia Type:General  Level of Consciousness: awake, alert  and oriented  Airway & Oxygen Therapy: Patient Spontanous Breathing and Patient connected to face mask oxygen  Post-op Assessment: Report given to RN and Post -op Vital signs reviewed and stable  Post vital signs: Reviewed and stable  Last Vitals:  Vitals:   11/09/17 1105  BP: 131/61  Pulse: 81  Resp: 11  Temp: 36.7 C  SpO2: 100%    Last Pain:  Vitals:   11/09/17 1105  TempSrc: Oral         Complications: No apparent anesthesia complications

## 2017-11-10 ENCOUNTER — Inpatient Hospital Stay (HOSPITAL_COMMUNITY): Payer: Medicare Other

## 2017-11-10 ENCOUNTER — Encounter (HOSPITAL_COMMUNITY): Payer: Self-pay | Admitting: Gastroenterology

## 2017-11-10 DIAGNOSIS — G8929 Other chronic pain: Secondary | ICD-10-CM

## 2017-11-10 DIAGNOSIS — R1011 Right upper quadrant pain: Secondary | ICD-10-CM

## 2017-11-10 DIAGNOSIS — E43 Unspecified severe protein-calorie malnutrition: Secondary | ICD-10-CM

## 2017-11-10 DIAGNOSIS — C801 Malignant (primary) neoplasm, unspecified: Secondary | ICD-10-CM

## 2017-11-10 HISTORY — PX: IR BILIARY DRAIN PLACEMENT WITH CHOLANGIOGRAM: IMG6043

## 2017-11-10 LAB — CBC
HCT: 29.9 % — ABNORMAL LOW (ref 36.0–46.0)
Hemoglobin: 10.1 g/dL — ABNORMAL LOW (ref 12.0–15.0)
MCH: 34.9 pg — ABNORMAL HIGH (ref 26.0–34.0)
MCHC: 33.8 g/dL (ref 30.0–36.0)
MCV: 103.5 fL — AB (ref 78.0–100.0)
PLATELETS: 178 10*3/uL (ref 150–400)
RBC: 2.89 MIL/uL — AB (ref 3.87–5.11)
RDW: 19.2 % — ABNORMAL HIGH (ref 11.5–15.5)
WBC: 6.2 10*3/uL (ref 4.0–10.5)

## 2017-11-10 LAB — COMPREHENSIVE METABOLIC PANEL
ALT: 89 U/L — AB (ref 14–54)
AST: 113 U/L — ABNORMAL HIGH (ref 15–41)
Albumin: 2.9 g/dL — ABNORMAL LOW (ref 3.5–5.0)
Alkaline Phosphatase: 313 U/L — ABNORMAL HIGH (ref 38–126)
Anion gap: 10 (ref 5–15)
BUN: 16 mg/dL (ref 6–20)
CALCIUM: 9 mg/dL (ref 8.9–10.3)
CHLORIDE: 103 mmol/L (ref 101–111)
CO2: 22 mmol/L (ref 22–32)
CREATININE: 0.79 mg/dL (ref 0.44–1.00)
GFR calc Af Amer: >60 mL/min (ref >60)
Glucose, Bld: 140 mg/dL — ABNORMAL HIGH (ref 65–99)
Potassium: 4.2 mmol/L (ref 3.5–5.1)
Sodium: 135 mmol/L (ref 135–145)
Total Bilirubin: 13 mg/dL — ABNORMAL HIGH (ref 0.3–1.2)
Total Protein: 6.4 g/dL — ABNORMAL LOW (ref 6.5–8.1)

## 2017-11-10 MED ORDER — MIDAZOLAM HCL 2 MG/2ML IJ SOLN
INTRAMUSCULAR | Status: AC | PRN
  Administered 2017-11-10 (×3): 1 mg via INTRAVENOUS

## 2017-11-10 MED ORDER — FENTANYL CITRATE (PF) 100 MCG/2ML IJ SOLN
INTRAMUSCULAR | Status: AC | PRN
  Administered 2017-11-10 (×3): 50 ug via INTRAVENOUS

## 2017-11-10 MED ORDER — FENTANYL CITRATE (PF) 100 MCG/2ML IJ SOLN
INTRAMUSCULAR | Status: AC
  Filled 2017-11-10: qty 4

## 2017-11-10 MED ORDER — IOPAMIDOL (ISOVUE-300) INJECTION 61%
50.0000 mL | Freq: Once | INTRAVENOUS | Status: AC | PRN
  Administered 2017-11-10: 30 mL

## 2017-11-10 MED ORDER — MORPHINE SULFATE (PF) 4 MG/ML IV SOLN
2.0000 mg | INTRAVENOUS | Status: DC | PRN
  Administered 2017-11-10 (×2): 2 mg via INTRAVENOUS
  Filled 2017-11-10 (×2): qty 1

## 2017-11-10 MED ORDER — SODIUM CHLORIDE 0.9% FLUSH
5.0000 mL | Freq: Three times a day (TID) | INTRAVENOUS | Status: DC
  Administered 2017-11-11 – 2017-11-12 (×3): 5 mL

## 2017-11-10 MED ORDER — LIDOCAINE HCL 1 % IJ SOLN
INTRAMUSCULAR | Status: AC
  Filled 2017-11-10: qty 20

## 2017-11-10 MED ORDER — IOPAMIDOL (ISOVUE-300) INJECTION 61%
INTRAVENOUS | Status: AC
  Administered 2017-11-10: 30 mL
  Filled 2017-11-10: qty 50

## 2017-11-10 MED ORDER — LIDOCAINE HCL (PF) 1 % IJ SOLN
INTRAMUSCULAR | Status: AC | PRN
  Administered 2017-11-10: 30 mL

## 2017-11-10 MED ORDER — PIPERACILLIN-TAZOBACTAM 3.375 G IVPB
INTRAVENOUS | Status: AC
  Administered 2017-11-10: 3.375 g via INTRAVENOUS
  Filled 2017-11-10: qty 50

## 2017-11-10 MED ORDER — MIDAZOLAM HCL 2 MG/2ML IJ SOLN
INTRAMUSCULAR | Status: AC
  Filled 2017-11-10: qty 4

## 2017-11-10 NOTE — Progress Notes (Addendum)
Initial Nutrition Assessment  DOCUMENTATION CODES:   Severe malnutrition in context of acute illness/injury  INTERVENTION:   - Ensure Enlive po BID once diet is advanced, each supplement provides 350 kcal and 20 grams of protein (pt prefers VANILLA flavor) - Encourage PO intake  NUTRITION DIAGNOSIS:   Severe Malnutrition related to acute illness(recent diagnosis of pancreatic adenocarcinoma) as evidenced by percent weight loss, moderate muscle depletion, moderate fat depletion.  GOAL:   Patient will meet greater than or equal to 90% of their needs  MONITOR:   Diet advancement, Weight trends, Supplement acceptance, PO intake  REASON FOR ASSESSMENT:   Malnutrition Screening Tool   ASSESSMENT:   72 year old female with PMH significant for Raynaud's and IBS with recent diagnosis of pancreatic mass. Presented for an EUS and ERCP for stent placement for obstructive jaundice but unable to place stent. Pt to IR on 11/10/17 for PTC and biliary drain.  Spoke to pt at bedside who reports she is hungry. Explained purpose of NPO pending PTC and biliary drain. Pt states that her appetite has been declining since the end of December and that she hasn't been able to eat much due to nausea. Pt states intake PTA included two small meals daily and an Ensure supplement once daily. Meals may include eggs, yogurt, and foods similar to those pt has had on the full liquid diet. Pt agreeable to receiving Ensure Enlive oral nutrition supplement once diet is advanced.  Pt states that her UBW is 112-115 lbs and that she last weighed this in "at the first of January of this year." Pt states she has noticed she is losing weight as her clothes are "falling off" of her. Pt has experienced a 7.1% weight loss in less than 2 months which is significant for timeframe. Last recorded weight in pt's chart is 117 lbs in January 116.  Medications reviewed.  Labs reviewed: alkaline phosphatase 313 (H), AST 113 (H), ALT 89  (H)  NUTRITION - FOCUSED PHYSICAL EXAM:    Most Recent Value  Orbital Region  Mild depletion  Upper Arm Region  Moderate depletion  Thoracic and Lumbar Region  Moderate depletion  Buccal Region  No depletion  Temple Region  Mild depletion  Clavicle Bone Region  Moderate depletion  Clavicle and Acromion Bone Region  Moderate depletion  Scapular Bone Region  Mild depletion  Dorsal Hand  Mild depletion  Patellar Region  Moderate depletion  Anterior Thigh Region  Moderate depletion  Posterior Calf Region  Moderate depletion  Edema (RD Assessment)  None  Hair  Reviewed  Eyes  Reviewed  Mouth  Reviewed  Skin  Reviewed  Nails  Reviewed       Diet Order:  Diet NPO time specified  EDUCATION NEEDS:   No education needs have been identified at this time  Skin:  Skin Assessment: Reviewed RN Assessment  Last BM:  11/08/17  Height:   Ht Readings from Last 1 Encounters:  11/09/17 '4\' 11"'$  (1.499 m)    Weight:   Wt Readings from Last 1 Encounters:  11/10/17 104 lb 9.6 oz (47.4 kg)    Ideal Body Weight:  43.2 kg  BMI:  Body mass index is 21.13 kg/m.  Estimated Nutritional Needs:   Kcal:  1250-1450 kcal/day  Protein:  60-70 grams/day  Fluid:  >1.3 L/day    Gaynell Face, MS, RD, LDN Pager: [phone] Weekend/After Hours: [phone]

## 2017-11-10 NOTE — Progress Notes (Signed)
ONCOLOGY COURTESY NOTE: Alerted to this patient's situation by Dr Erlinda Hong. I discussed the case with our GI Oncologists and Dr Truitt Merle will see the patient next week, most likely as outpatient, once the biopsy results are available.  Our GI navigator, Maurice, [phone]) willl contact patient with date and time of visit.  Please let me know if I can be of further help.

## 2017-11-10 NOTE — Procedures (Signed)
Jaundice, biliary obstruction  S/p [place] and fluoro Rt external only biliary drain  Bile cx sent  No comp EBL 5cc Keep to gravity bag Full report in pacs

## 2017-11-10 NOTE — Progress Notes (Addendum)
[facility] Progress Note  CC:  Pancreatic cancer; obstructive jaundice  Subjective:  Feels fine.  Awaiting PTC, which will be performed by IR today.  Objective:  Vital signs in last 24 hours: Temp:  [97.5 F (36.4 C)-98 F (36.7 C)] 98 F (36.7 C) (02/22 0526) Pulse Rate:  [62-81] 68 (02/22 0526) Resp:  [11-19] 18 (02/22 0526) BP: (101-164)/(57-116) 117/57 (02/22 0526) SpO2:  [96 %-100 %] 96 % (02/22 0526) Weight:  [104 lb 9.6 oz (47.4 kg)-106 lb (48.1 kg)] 104 lb 9.6 oz (47.4 kg) (02/22 0526) Last BM Date: 11/08/17 General:  Alert, Well-developed, in NAD; gross jaundice noted. Heart:  Regular rate and rhythm; no murmurs Pulm:  CTAB.  No increased WOB. Abdomen:  Soft, non-distended.  BS present.  Non-tender. Extremities:  Without edema. Neurologic:  Alert and oriented x 4;  grossly normal neurologically. Psych:  Alert and cooperative. Normal mood and affect.  Intake/Output from previous day: 02/21 0701 - 02/22 0700 In: 1120 [P.O.:120; I.V.:1000] Out: -   Lab Results: Recent Labs    11/10/17 0353  WBC 6.2  HGB 10.1*  HCT 29.9*  PLT 178   BMET Recent Labs    11/10/17 0353  NA 135  K 4.2  CL 103  CO2 22  GLUCOSE 140*  BUN 16  CREATININE 0.79  CALCIUM 9.0   LFT Recent Labs    11/09/17 1134 11/10/17 0353  PROT 7.9 6.4*  ALBUMIN 3.7 2.9*  AST 138* 113*  ALT 112* 89*  ALKPHOS 404* 313*  BILITOT 14.8* 13.0*  BILIDIR 8.2*  --   IBILI 6.6*  --    Dg Ercp Biliary & Pancreatic Ducts  Result Date: 11/09/2017 CLINICAL DATA:  Pancreatic mass.  Attempted ERCP. EXAM: ERCP TECHNIQUE: Multiple spot images obtained with the fluoroscopic device and submitted for interpretation post-procedure. COMPARISON:  CT abdomen and pelvis - 11/03/2017 FLUOROSCOPY TIME:  13 minutes, 43 seconds FINDINGS: 3 spot intraoperative fluoroscopic images of the right upper abdominal quadrant during ERCP are provided for review. Initial image demonstrates an ERCP probe  overlying the right upper abdominal quadrant. There is passage of a wire through the CBD and into the origin of the cystic duct with faint contrast opacification. Subsequent images demonstrate faint opacification the distal aspect of the CBD as well as the pancreatic duct. There is no significant opacification intrahepatic biliary system. No are discrete filling defects within the opacified portions of the biliary system. IMPRESSION: ERCP as above. These images were submitted for radiologic interpretation only. Please see the procedural report for the amount of contrast and the fluoroscopy time utilized. Electronically Signed   By: Sandi Mariscal M.D.   On: 11/09/2017 14:34   Assessment / Plan: *Newly diagnosed pancreatic adenocarcinoma with obstructive jaundice:  Underwent EUS by Dr. Ardis Hughs on 2/21 but ERCP with stent was unsuccessful.  For PTC with IR today.  **We will see the patient again on Monday if she is still in the hospital.   LOS: 1 day   Jessica D. Zehr  11/10/2017, 9:18 AM  Pager number [phone]   I have discussed the case with the PA, and that is the plan I formulated. I personally interviewed and examined the patient.  CC: Obstructive jaundice  Pancreatic cancer, metastatic Right upper quadrant pain  I saw Landen just after her return from interventional radiology, where she underwent placement of an external biliary drain. She is having a lot of right upper quadrant pain, and nurse is about to  administer morphine for relief.  The radiologist's note indicates the drain placement was successful, and she has bile in the bedside drainage bag.  I have ordered a regular diet for when she feels ready to do so. I have not been able to speak to the radiologist personally today, but I suspect their plan is for biliary drainage over the weekend, then placement of permanent metal biliary stent next week.  Dr. Nandigam will be on duty over the weekend and will see this patient if  called.   Doctor Sheahan L Danis III Pager [phone]  Mon-Fri 8a-5p [phone] after 5p, weekends, holidays   

## 2017-11-10 NOTE — Progress Notes (Signed)
Pt scheduled for PTC with biliary drain placement today; Risks and benefits discussed with the patient including bleeding, infection, damage to adjacent structures, bowel perforation/fistula connection, and sepsis.  All of the patient's questions were answered, patient is agreeable to proceed. Consent signed and in chart.  Order confirmed with GI service

## 2017-11-10 NOTE — Progress Notes (Addendum)
PROGRESS NOTE  Elizabeth Kidd ERX:[id number] DOB: 12-18-1945 DOA: 11/09/2017 PCP: Burnard Bunting, MD  HPI/Recap of past 24 hours:  Visible jaundice, no fever, no pain, no nausea, no vomiting She is n.p.o., awaiting for pursestring placement  Assessment/Plan: Principal Problem:   Pancreatic mass Active Problems:   Obstructive jaundice due to malignant neoplasm ([place])   Raynaud's disease  Obstructive jaundice secondary to pancreatic mass -Unsuccessful stenting with ERCP yesterday, cytology pending -IR consulted for proctoring today -We will check CEA CA-19-9 -Case discussed with oncology Dr. Jana Hakim, who will see patient in consult  Macrocytic anemia MCV 103.5, hemoglobin 10 We will check B12, folate, TSH  History  Raynaud's disease, currently denies symptoms.  Body mass index is 21.13 kg/m.   Code Status: full  Family Communication: patient   Disposition Plan: pending   Consultants:  [facility]I  IR  Oncology Dr Jana Hakim ( case discussed with oncology Dr Burr Medico, she is going to see patient on Tuesday in her clinic)  Procedures:  Ercp on 2/21  IR pct on 2/22  Antibiotics:  Perioperative zosyn   Objective: BP (!) 117/57 (BP Location: Left Arm)   Pulse 68   Temp 98 F (36.7 C) (Oral)   Resp 18   Ht 4\' 11"  (1.499 m)   Wt 47.4 kg (104 lb 9.6 oz)   SpO2 96%   BMI 21.13 kg/m   Intake/Output Summary (Last 24 hours) at 11/10/2017 1206 Last data filed at 11/10/2017 0528 Gross per 24 hour  Intake 1120 ml  Output 2 ml  Net 1118 ml   Filed Weights   11/09/17 1105 11/10/17 0526  Weight: 48.1 kg (106 lb) 47.4 kg (104 lb 9.6 oz)    Exam: Patient is examined daily including today on 11/10/2017, exams remain the same as of yesterday except that has changed    General:  NAD, visible jaundice  Cardiovascular: RRR  Respiratory: CTABL  Abdomen: Soft/ND/NT, positive BS  Musculoskeletal: No Edema  Neuro: alert, oriented   Data Reviewed: Basic  Metabolic Panel: Recent Labs  Lab 11/10/17 0353  NA 135  K 4.2  CL 103  CO2 22  GLUCOSE 140*  BUN 16  CREATININE 0.79  CALCIUM 9.0   Liver Function Tests: Recent Labs  Lab 11/09/17 1134 11/10/17 0353  AST 138* 113*  ALT 112* 89*  ALKPHOS 404* 313*  BILITOT 14.8* 13.0*  PROT 7.9 6.4*  ALBUMIN 3.7 2.9*   No results for input(s): LIPASE, AMYLASE in the last 168 hours. No results for input(s): AMMONIA in the last 168 hours. CBC: Recent Labs  Lab 11/10/17 0353  WBC 6.2  HGB 10.1*  HCT 29.9*  MCV 103.5*  PLT 178   Cardiac Enzymes:   No results for input(s): CKTOTAL, CKMB, CKMBINDEX, TROPONINI in the last 168 hours. BNP (last 3 results) No results for input(s): BNP in the last 8760 hours.  ProBNP (last 3 results) No results for input(s): PROBNP in the last 8760 hours.  CBG: No results for input(s): GLUCAP in the last 168 hours.  No results found for this or any previous visit (from the past 240 hour(s)).   Studies: Dg Ercp Biliary & Pancreatic Ducts  Result Date: 11/09/2017 CLINICAL DATA:  Pancreatic mass.  Attempted ERCP. EXAM: ERCP TECHNIQUE: Multiple spot images obtained with the fluoroscopic device and submitted for interpretation post-procedure. COMPARISON:  CT abdomen and pelvis - 11/03/2017 FLUOROSCOPY TIME:  13 minutes, 43 seconds FINDINGS: 3 spot intraoperative fluoroscopic images of the right upper abdominal quadrant during  ERCP are provided for review. Initial image demonstrates an ERCP probe overlying the right upper abdominal quadrant. There is passage of a wire through the CBD and into the origin of the cystic duct with faint contrast opacification. Subsequent images demonstrate faint opacification the distal aspect of the CBD as well as the pancreatic duct. There is no significant opacification intrahepatic biliary system. No are discrete filling defects within the opacified portions of the biliary system. IMPRESSION: ERCP as above. These images were  submitted for radiologic interpretation only. Please see the procedural report for the amount of contrast and the fluoroscopy time utilized. Electronically Signed   By: Sandi Mariscal M.D.   On: 11/09/2017 14:34    Scheduled Meds: . sodium chloride flush  3 mL Intravenous Q12H    Continuous Infusions: . sodium chloride    . piperacillin-tazobactam (ZOSYN)  IV       Time spent: 10mins I have personally reviewed and interpreted on  11/10/2017 daily labs, tele strips, imagings as discussed above under date review session and assessment and plans.  I reviewed all nursing notes, pharmacy notes, consultant notes,  vitals, pertinent old records  I have discussed plan of care as described above with RN , patient  on 11/10/2017   Florencia Reasons MD, PhD  [facility] Pager [phone]. If 7PM-7AM, please contact night-coverage at [email or web address], password [facility] 11/10/2017, 12:06 PM  LOS: 1 day

## 2017-11-11 LAB — CBC WITH DIFFERENTIAL/PLATELET
Basophils Absolute: 0 10*3/uL (ref 0.0–0.1)
Basophils Relative: 0 %
EOS ABS: 0.1 10*3/uL (ref 0.0–0.7)
EOS PCT: 2 %
HCT: 32.3 % — ABNORMAL LOW (ref 36.0–46.0)
Hemoglobin: 10.5 g/dL — ABNORMAL LOW (ref 12.0–15.0)
LYMPHS ABS: 0.6 10*3/uL — AB (ref 0.7–4.0)
Lymphocytes Relative: 10 %
MCH: 34.5 pg — AB (ref 26.0–34.0)
MCHC: 32.5 g/dL (ref 30.0–36.0)
MCV: 106.3 fL — ABNORMAL HIGH (ref 78.0–100.0)
MONO ABS: 0.3 10*3/uL (ref 0.1–1.0)
Monocytes Relative: 5 %
Neutro Abs: 4.7 10*3/uL (ref 1.7–7.7)
Neutrophils Relative %: 83 %
PLATELETS: 169 10*3/uL (ref 150–400)
RBC: 3.04 MIL/uL — AB (ref 3.87–5.11)
RDW: 19.8 % — ABNORMAL HIGH (ref 11.5–15.5)
WBC: 5.7 10*3/uL (ref 4.0–10.5)

## 2017-11-11 LAB — COMPREHENSIVE METABOLIC PANEL
ALT: 88 U/L — AB (ref 14–54)
ANION GAP: 8 (ref 5–15)
AST: 108 U/L — ABNORMAL HIGH (ref 15–41)
Albumin: 3 g/dL — ABNORMAL LOW (ref 3.5–5.0)
Alkaline Phosphatase: 299 U/L — ABNORMAL HIGH (ref 38–126)
BUN: 15 mg/dL (ref 6–20)
CO2: 24 mmol/L (ref 22–32)
Calcium: 8.8 mg/dL — ABNORMAL LOW (ref 8.9–10.3)
Chloride: 103 mmol/L (ref 101–111)
Creatinine, Ser: 0.95 mg/dL (ref 0.44–1.00)
GFR calc non Af Amer: 58 mL/min — ABNORMAL LOW (ref >60)
Glucose, Bld: 110 mg/dL — ABNORMAL HIGH (ref 65–99)
POTASSIUM: 4.2 mmol/L (ref 3.5–5.1)
SODIUM: 135 mmol/L (ref 135–145)
Total Bilirubin: 10 mg/dL — ABNORMAL HIGH (ref 0.3–1.2)
Total Protein: 6.6 g/dL (ref 6.5–8.1)

## 2017-11-11 LAB — TSH: TSH: 2.983 u[IU]/mL (ref 0.350–4.500)

## 2017-11-11 LAB — VITAMIN B12: VITAMIN B 12: 1822 pg/mL — AB (ref 180–914)

## 2017-11-11 LAB — MAGNESIUM: MAGNESIUM: 1.6 mg/dL — AB (ref 1.7–2.4)

## 2017-11-11 LAB — FOLATE: FOLATE: 19.6 ng/mL (ref >5.9)

## 2017-11-11 MED ORDER — OXYCODONE HCL 5 MG PO TABS
5.0000 mg | ORAL_TABLET | Freq: Four times a day (QID) | ORAL | Status: DC | PRN
  Administered 2017-11-11 (×2): 5 mg via ORAL
  Filled 2017-11-11 (×2): qty 1

## 2017-11-11 MED ORDER — ENSURE ENLIVE PO LIQD: 237.0000 mL | Freq: Two times a day (BID) | ORAL | Status: DC

## 2017-11-11 MED ORDER — MAGNESIUM SULFATE 2 GM/50ML IV SOLN
2.0000 g | Freq: Once | INTRAVENOUS | Status: AC
  Administered 2017-11-11: 2 g via INTRAVENOUS
  Filled 2017-11-11: qty 50

## 2017-11-11 NOTE — Progress Notes (Signed)
PROGRESS NOTE  Shawan Tosh PTW:[id number] DOB: 1945-10-26 DOA: 11/09/2017 PCP: Burnard Bunting, MD  HPI/Recap of past 24 hours:  She has external biliary drain placed yesterday She is feeling better, no fever, no n/v She reports "sorness" around the drain   Assessment/Plan: Principal Problem:   Pancreatic mass Active Problems:   Obstructive jaundice due to malignant neoplasm ([place])   Raynaud's disease   Protein-calorie malnutrition, severe  Obstructive jaundice secondary to pancreatic mass -Unsuccessful stenting with ERCP yesterday, cytology pending -s/p external abdominal drain by IR yesterday, drain culture no growth ,no WBC, Gram stain negative.  TBili coming down - CEA /CA-19-9 pending -Case discussed with  interventional radiology today, drain care and follow-up per IR.  Appreciate IR input. -Case discussed with oncology Dr. Burr Medico who will arrange follow-up on Tuesday.  Hypomagnesemia Replace mag.  Macrocytic anemia MCV 103.5, hemoglobin 10 B12, folate, TSH unremarkable  History  Raynaud's disease, currently denies symptoms.  Body mass index is 20.97 kg/m.   Code Status: full  Family Communication: patient   Disposition Plan: Home likely tomorrow   Consultants:  [facility]I  IR  Oncology Dr Jana Hakim ( case discussed with oncology Dr Burr Medico, she is going to see patient on Tuesday in her clinic)  Procedures:  Ercp on 2/21  IR pct on 2/22  Antibiotics:  Perioperative zosyn   Objective: BP (!) 107/58 (BP Location: Left Arm)   Pulse 79   Temp 98.3 F (36.8 C) (Oral)   Resp 18   Ht 4\' 11"  (1.499 m)   Wt 47.1 kg (103 lb 13.4 oz)   SpO2 94%   BMI 20.97 kg/m   Intake/Output Summary (Last 24 hours) at 11/11/2017 1302 Last data filed at 11/11/2017 0900 Gross per 24 hour  Intake 855 ml  Output 425 ml  Net 430 ml   Filed Weights   11/09/17 1105 11/10/17 0526 11/11/17 0500  Weight: 48.1 kg (106 lb) 47.4 kg (104 lb 9.6 oz) 47.1 kg (103 lb 13.4 oz)     Exam: Patient is examined daily including today on 11/11/2017, exams remain the same as of yesterday except that has changed    General:  NAD, less jaundice  Cardiovascular: RRR  Respiratory: CTABL  Abdomen: Soft/ND/NT, positive BS  Musculoskeletal: No Edema  Neuro: alert, oriented   Data Reviewed: Basic Metabolic Panel: Recent Labs  Lab 11/10/17 0353 11/11/17 0431  NA 135 135  K 4.2 4.2  CL 103 103  CO2 22 24  GLUCOSE 140* 110*  BUN 16 15  CREATININE 0.79 0.95  CALCIUM 9.0 8.8*  MG  --  1.6*   Liver Function Tests: Recent Labs  Lab 11/09/17 1134 11/10/17 0353 11/11/17 0431  AST 138* 113* 108*  ALT 112* 89* 88*  ALKPHOS 404* 313* 299*  BILITOT 14.8* 13.0* 10.0*  PROT 7.9 6.4* 6.6  ALBUMIN 3.7 2.9* 3.0*   No results for input(s): LIPASE, AMYLASE in the last 168 hours. No results for input(s): AMMONIA in the last 168 hours. CBC: Recent Labs  Lab 11/10/17 0353 11/11/17 0431  WBC 6.2 5.7  NEUTROABS  --  4.7  HGB 10.1* 10.5*  HCT 29.9* 32.3*  MCV 103.5* 106.3*  PLT 178 169   Cardiac Enzymes:   No results for input(s): CKTOTAL, CKMB, CKMBINDEX, TROPONINI in the last 168 hours. BNP (last 3 results) No results for input(s): BNP in the last 8760 hours.  ProBNP (last 3 results) No results for input(s): PROBNP in the last 8760 hours.  CBG:  No results for input(s): GLUCAP in the last 168 hours.  Recent Results (from the past 240 hour(s))  Aerobic/Anaerobic Culture (surgical/deep wound)     Status: None (Preliminary result)   Collection Time: 11/10/17  3:32 PM  Result Value Ref Range Status   Specimen Description   Final    BILE Performed at [facility], [place] [address] [place], [place] [address]    Special Requests   Final    Normal Performed at [facility], [place] [address], [place], [place] [address]    Gram Stain NO WBC SEEN NO ORGANISMS SEEN   Final   Culture   Final    NO GROWTH 1 DAY Performed  at [facility], [place] [address] [place], [place] [address]    Report Status PENDING  Incomplete     Studies: Ir Biliary Drain Placement With Cholangiogram  Result Date: 11/10/2017 INDICATION: Pancreas mass, biliary obstruction, jaundice EXAM: ULTRASOUND AND FLUOROSCOPIC RIGHT EXTERNAL ONLY 10 FRENCH BILIARY DRAIN MEDICATIONS: 3.375 G ZOSYN; The antibiotic was administered within an appropriate time frame prior to the initiation of the procedure. ANESTHESIA/SEDATION: Moderate (conscious) sedation was employed during this procedure. A total of Versed 3 mg and Fentanyl 150 mcg was administered intravenously. Moderate Sedation Time: 47 minutes. The patient's level of consciousness and vital signs were monitored continuously by radiology nursing throughout the procedure under my direct supervision. FLUOROSCOPY TIME:  Fluoroscopy Time: 11 minutes 54 seconds (101 mGy). COMPLICATIONS: None immediate. PROCEDURE: Informed written consent was obtained from the patient after a thorough discussion of the procedural risks, benefits and alternatives. All questions were addressed. Maximal Sterile Barrier Technique was utilized including caps, mask, sterile gowns, sterile gloves, sterile drape, hand hygiene and skin antiseptic. A timeout was performed prior to the initiation of the procedure. Preliminary ultrasound performed. Dilated right hepatic ducts demonstrated. Lower intercostal space marked. Under sterile conditions and local anesthesia, a 21 gauge access needle was advanced percutaneously to a peripheral right hepatic duct. Contrast injection performed for cholangiogram. Diffuse biliary dilatation noted. Distal CBD obstruction evident. Eventually a 018 guidewire advanced into the biliary tree. Accustick dilator set advanced. Amplatz guidewire exchange performed. Kumpe catheter and Glidewire were utilized to attempt manipulation past the biliary obstruction however this was unsuccessful related to the marked  dilatation of the CBD. Amplatz guidewire placed to insert a [place] catheter. Retention loop formed in the obstructed dilated common bile duct. Ducts were decompressed by syringe aspiration. Catheter secured with a Prolene suture and connected to external gravity drainage bag. Sterile dressing applied. No immediate complication. Patient tolerated the procedure well. IMPRESSION: Successful ultrasound and fluoroscopic 10 French right external biliary drain insertion. Bile culture sent. Electronically Signed   By: Jerilynn Mages.  Shick M.D.   On: 11/10/2017 15:42    Scheduled Meds: . sodium chloride flush  3 mL Intravenous Q12H  . sodium chloride flush  5 mL Intracatheter Q8H    Continuous Infusions: . sodium chloride       Time spent: 84mins I have personally reviewed and interpreted on  11/11/2017 daily labs, tele strips, imagings as discussed above under date review session and assessment and plans.  I reviewed all nursing notes, pharmacy notes, consultant notes,  vitals, pertinent old records  I have discussed plan of care as described above with RN , patient  on 11/11/2017   Florencia Reasons MD, PhD  [facility] Pager [phone]. If 7PM-7AM, please contact night-coverage at [email or web address], password [facility] 11/11/2017, 1:02  PM  LOS: 2 days

## 2017-11-11 NOTE — Progress Notes (Signed)
COURTESY VISIT: [place] in to orient patient to our GI malignancy team. If she is still in the hospital 02/26 Dr Grant Ruts will see her here; otherwise the patient will be seen 02/26 at 3 PM in the cancer center. Patient is aware.

## 2017-11-11 NOTE — Progress Notes (Signed)
Referring Physician(s): Dr. Loletha Carrow  Supervising Physician: Marybelle Killings  Patient Status:  [facility] - In-pt  Chief Complaint: Pancreatic cancer, obstructive jaundice  Subjective: Eating her first meal.  Having some tenderness around drain site.   Allergies: Patient has no known allergies.  Medications: Prior to Admission medications   Medication Sig Start Date End Date Taking? Authorizing Provider  Cholecalciferol (VITAMIN D3 PO) Take 1 capsule by mouth daily.   Yes [provider]  Polyethyl Glycol-Propyl Glycol (SYSTANE OP) Place 1 drop into both eyes 3 (three) times daily as needed (for dry eyes).   Yes [provider]     Vital Signs: BP (!) 127/57 (BP Location: Left Arm)   Pulse 86   Temp 98.9 F (37.2 C) (Oral)   Resp 16   Ht 4\' 11"  (1.499 m)   Wt 103 lb 13.4 oz (47.1 kg)   SpO2 98%   BMI 20.97 kg/m   Physical Exam  Constitutional: She is oriented to person, place, and time. She appears well-developed.  Cardiovascular: Normal rate, regular rhythm and normal heart sounds.  Pulmonary/Chest: Effort normal and breath sounds normal. No respiratory distress.  Abdominal: Soft. There is tenderness (at drain site in RUQ).  Drain in place.  Insertion site assessed. Fresh blood around tube.  No active bleeding or oozing.  Large amount of output in collection bag- dark, bilious.   Neurological: She is alert and oriented to person, place, and time.  Skin: Skin is warm and dry.  Nursing note and vitals reviewed.   Imaging: Dg Ercp Biliary & Pancreatic Ducts  Result Date: 11/09/2017 CLINICAL DATA:  Pancreatic mass.  Attempted ERCP. EXAM: ERCP TECHNIQUE: Multiple spot images obtained with the fluoroscopic device and submitted for interpretation post-procedure. COMPARISON:  CT abdomen and pelvis - 11/03/2017 FLUOROSCOPY TIME:  13 minutes, 43 seconds FINDINGS: 3 spot intraoperative fluoroscopic images of the right upper abdominal quadrant during ERCP are  provided for review. Initial image demonstrates an ERCP probe overlying the right upper abdominal quadrant. There is passage of a wire through the CBD and into the origin of the cystic duct with faint contrast opacification. Subsequent images demonstrate faint opacification the distal aspect of the CBD as well as the pancreatic duct. There is no significant opacification intrahepatic biliary system. No are discrete filling defects within the opacified portions of the biliary system. IMPRESSION: ERCP as above. These images were submitted for radiologic interpretation only. Please see the procedural report for the amount of contrast and the fluoroscopy time utilized. Electronically Signed   By: Sandi Mariscal M.D.   On: 11/09/2017 14:34   Ir Biliary Drain Placement With Cholangiogram  Result Date: 11/10/2017 INDICATION: Pancreas mass, biliary obstruction, jaundice EXAM: ULTRASOUND AND FLUOROSCOPIC RIGHT EXTERNAL ONLY 10 FRENCH BILIARY DRAIN MEDICATIONS: 3.375 G ZOSYN; The antibiotic was administered within an appropriate time frame prior to the initiation of the procedure. ANESTHESIA/SEDATION: Moderate (conscious) sedation was employed during this procedure. A total of Versed 3 mg and Fentanyl 150 mcg was administered intravenously. Moderate Sedation Time: 47 minutes. The patient's level of consciousness and vital signs were monitored continuously by radiology nursing throughout the procedure under my direct supervision. FLUOROSCOPY TIME:  Fluoroscopy Time: 11 minutes 54 seconds (101 mGy). COMPLICATIONS: None immediate. PROCEDURE: Informed written consent was obtained from the patient after a thorough discussion of the procedural risks, benefits and alternatives. All questions were addressed. Maximal Sterile Barrier Technique was utilized including caps, mask, sterile gowns, sterile gloves, sterile drape, hand hygiene and  skin antiseptic. A timeout was performed prior to the initiation of the procedure. Preliminary  ultrasound performed. Dilated right hepatic ducts demonstrated. Lower intercostal space marked. Under sterile conditions and local anesthesia, a 21 gauge access needle was advanced percutaneously to a peripheral right hepatic duct. Contrast injection performed for cholangiogram. Diffuse biliary dilatation noted. Distal CBD obstruction evident. Eventually a 018 guidewire advanced into the biliary tree. Accustick dilator set advanced. Amplatz guidewire exchange performed. Kumpe catheter and Glidewire were utilized to attempt manipulation past the biliary obstruction however this was unsuccessful related to the marked dilatation of the CBD. Amplatz guidewire placed to insert a [place] catheter. Retention loop formed in the obstructed dilated common bile duct. Ducts were decompressed by syringe aspiration. Catheter secured with a Prolene suture and connected to external gravity drainage bag. Sterile dressing applied. No immediate complication. Patient tolerated the procedure well. IMPRESSION: Successful ultrasound and fluoroscopic 10 French right external biliary drain insertion. Bile culture sent. Electronically Signed   By: Jerilynn Mages.  Shick M.D.   On: 11/10/2017 15:42    Labs:  CBC: Recent Labs    10/31/17 1443 11/10/17 0353 11/11/17 0431  WBC 5.6 6.2 5.7  HGB 11.5* 10.1* 10.5*  HCT 34.2* 29.9* 32.3*  PLT 170.0 178 169    COAGS: Recent Labs    10/31/17 1443  INR 1.2*    BMP: Recent Labs    10/31/17 1443 11/10/17 0353 11/11/17 0431  NA 139 135 135  K 4.3 4.2 4.2  CL 102 103 103  CO2 30 22 24   GLUCOSE 121* 140* 110*  BUN 15 16 15   CALCIUM 9.5 9.0 8.8*  CREATININE 0.83 0.79 0.95  GFRNONAA  --  >60 58*  GFRAA  --  >60 >60    LIVER FUNCTION TESTS: Recent Labs    10/31/17 1443 11/09/17 1134 11/10/17 0353 11/11/17 0431  BILITOT 9.4* 14.8* 13.0* 10.0*  AST 84* 138* 113* 108*  ALT 81* 112* 89* 88*  ALKPHOS 524* 404* 313* 299*  PROT 7.2 7.9 6.4* 6.6  ALBUMIN 3.9  3.7 2.9* 3.0*    Assessment and Plan: Obstructive jaundice s/p external biliary drain placement 11/10/17 Patient stable after drain placement yesterday.  She is afebrile and WBC remains WNL.  Her Tbili has improved to 10.0 from 13.  During assessment, patient is attempting to eat her first solid meal.  She may potentially discharge home over the weekend with plans to return for internal stent placement.  Of note, at the time of the procedure, all attempts to pass the biliary obstruction were unsuccessful.  If patient discharges home with drain in place, she can replace dressing as needed.  Collection bag can be emptied as needed.  Recommend daily flush with 10 mL sterile saline.  Will place outpatient orders.   Electronically Signed: Docia Barrier, PA 11/11/2017, 1:07 PM   I spent a total of 15 Minutes at the the patient's bedside AND on the patient's hospital floor or unit, greater than 50% of which was counseling/coordinating care for pancreatic cancer, obstructive jaundice.

## 2017-11-11 NOTE — Progress Notes (Signed)
Patient educated on drain care. Dressing was changed using split gauze and tape, patient observed and was involved. Patient also observed flushing the drain with 5 ml NS- pt did not want to flush drain yet, but wanted to observe the first time. Will notify next RN to allow pt to flush drain. Pt also educated on how to empty drainage bag and importance of measuring output. Pt verbalized understanding. Will continue to reinforce teaching until D/C.

## 2017-11-12 LAB — COMPREHENSIVE METABOLIC PANEL
ALBUMIN: 2.9 g/dL — AB (ref 3.5–5.0)
ALT: 82 U/L — AB (ref 14–54)
AST: 92 U/L — AB (ref 15–41)
Alkaline Phosphatase: 267 U/L — ABNORMAL HIGH (ref 38–126)
Anion gap: 8 (ref 5–15)
BUN: 14 mg/dL (ref 6–20)
CHLORIDE: 101 mmol/L (ref 101–111)
CO2: 26 mmol/L (ref 22–32)
CREATININE: 0.82 mg/dL (ref 0.44–1.00)
Calcium: 8.6 mg/dL — ABNORMAL LOW (ref 8.9–10.3)
GFR calc Af Amer: >60 mL/min (ref >60)
GFR calc non Af Amer: >60 mL/min (ref >60)
GLUCOSE: 117 mg/dL — AB (ref 65–99)
POTASSIUM: 4.3 mmol/L (ref 3.5–5.1)
Sodium: 135 mmol/L (ref 135–145)
Total Bilirubin: 8.9 mg/dL — ABNORMAL HIGH (ref 0.3–1.2)
Total Protein: 6.5 g/dL (ref 6.5–8.1)

## 2017-11-12 LAB — CBC WITH DIFFERENTIAL/PLATELET
BASOS ABS: 0 10*3/uL (ref 0.0–0.1)
BASOS PCT: 0 %
EOS PCT: 3 %
Eosinophils Absolute: 0.1 10*3/uL (ref 0.0–0.7)
HEMATOCRIT: 31 % — AB (ref 36.0–46.0)
Hemoglobin: 10.1 g/dL — ABNORMAL LOW (ref 12.0–15.0)
LYMPHS PCT: 10 %
Lymphs Abs: 0.5 10*3/uL — ABNORMAL LOW (ref 0.7–4.0)
MCH: 34.7 pg — ABNORMAL HIGH (ref 26.0–34.0)
MCHC: 32.6 g/dL (ref 30.0–36.0)
MCV: 106.5 fL — ABNORMAL HIGH (ref 78.0–100.0)
Monocytes Absolute: 0.5 10*3/uL (ref 0.1–1.0)
Monocytes Relative: 10 %
NEUTROS ABS: 3.9 10*3/uL (ref 1.7–7.7)
Neutrophils Relative %: 77 %
PLATELETS: 148 10*3/uL — AB (ref 150–400)
RBC: 2.91 MIL/uL — AB (ref 3.87–5.11)
RDW: 19.7 % — ABNORMAL HIGH (ref 11.5–15.5)
WBC: 5 10*3/uL (ref 4.0–10.5)

## 2017-11-12 LAB — CEA: CEA1: 3.9 ng/mL (ref 0.0–4.7)

## 2017-11-12 LAB — CANCER ANTIGEN 19-9: CA 19-9: 287 U/mL — ABNORMAL HIGH (ref 0–35)

## 2017-11-12 LAB — MAGNESIUM: Magnesium: 1.8 mg/dL (ref 1.7–2.4)

## 2017-11-12 MED ORDER — MAGNESIUM SULFATE 2 GM/50ML IV SOLN
2.0000 g | Freq: Once | INTRAVENOUS | Status: AC
  Administered 2017-11-12: 2 g via INTRAVENOUS
  Filled 2017-11-12: qty 50

## 2017-11-12 MED ORDER — ONDANSETRON HCL 4 MG PO TABS: 4.0000 mg | ORAL_TABLET | Freq: Four times a day (QID) | ORAL | 0 refills | Status: DC | PRN

## 2017-11-12 NOTE — Progress Notes (Signed)
Discharge paperwork explained to patient who verbalized understanding. Pt also aware of where to pick up prescription medication. Nausea & pain under control at this time. Biliary drain care instructions explained and demonstrated to patient again- pt was also able to demonstrate ability to properly care for drain. Pt changed the dry dressing, flushed the drain with 75ml NS, and emptied the drain with minimal assistance. All needed supplies given to patient for home. Pt states, "I think I'll be fine after doing it a couple more times." All questions answered. HH RN setup per CM. Pt aware of follow-up appointments also and which phone numbers to call with any questions. Awaiting patient's friend to arrive at this time for transportation.

## 2017-11-12 NOTE — Care Management Important Message (Signed)
Important Message  Patient Details  Name: Elizabeth Kidd MRN: [id number] Date of Birth: [birth year]   Medicare Important Message Given:  Yes    Erenest Rasher, RN 11/12/2017, 10:33 AM

## 2017-11-12 NOTE — Discharge Summary (Signed)
Discharge Summary  Elizabeth Kidd YIR:[id number] DOB: [birth year]  PCP: Burnard Bunting, MD  Admit date: 11/09/2017 Discharge date: 11/12/2017  Time spent: <37mins  Recommendations for Outpatient Follow-up:  1. F/u with PMD within a week  for hospital discharge follow up, repeat cbc/bmp at follow up 2. F/u with oncology Dr Burr Medico on 2/26 3. F/u with IR in one week for possible stent internalization 4. Home health RN for drain care.  Discharge Diagnoses:  Active Hospital Problems   Diagnosis Date Noted  . Pancreatic mass   . Protein-calorie malnutrition, severe 11/10/2017  . 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 Raynaud's disease     Resolved Hospital Problems  No resolved problems to display.    Discharge Condition: stable  Diet recommendation: regular diet  Filed Weights   11/10/17 0526 11/11/17 0500 11/12/17 0644  Weight: 47.4 kg (104 lb 9.6 oz) 47.1 kg (103 lb 13.4 oz) 46.8 kg (103 lb 3.2 oz)    History of present illness:  PCP: Burnard Bunting, MD  Patient coming from: Home  Chief Complaint: Yellow  HPI: Elizabeth Kidd is a 72 y.o. female with medical history significant of Raynard's, with recent diagnosis of pancreatic mass comes in today with GI Dr. Ardis Hughs for an EUS and ERCP for stent placement for her painless obstructive jaundice.  Dr. Ardis Hughs was unable to place a stent today as this area was too narrow.  Patient will need IR to do procedure in the morning to provide relief of her obstruction.  She denies any fevers.  She denies any pains.  She has nauseous she is about to get a dose of Zofran.  Prior to today she has been eating and drinking enough liquids.  Patient is currently in the endoscopic lab and will be admitted to [facility] for further evaluation by IR.    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  TBili coming down - CEA /CA-19-9 pending - drain care and follow-up per IR.   -Case discussed with oncology Dr. Burr Medico who will arrange follow-up on Tuesday. -case discussed with GI Dr Silverio Decamp who recommend GI follow up as needed.  Hypomagnesemia Replace mag.  Macrocytic anemia MCV 103.5, hemoglobin 10 B12, folate, TSH unremarkable  History  Raynaud's disease, currently denies symptoms.  Body mass index is 20.97 kg/m.   Code Status: full  Family Communication: patient   Disposition Plan: Home with home health   Consultants:  [facility]  IR  Oncology Dr Jana Kidd ( case discussed with oncology Dr Burr Medico, she is going to see patient on Tuesday in her clinic)  Procedures:  Ercp with unsuccessful stenting on 2/21  IR pct on 2/22  Antibiotics:  Perioperative zosyn   Discharge Exam: BP 114/72 (BP Location: Left Arm)   Pulse 85   Temp 98.4 F (36.9 C) (Oral)   Resp 16   Ht 4\' 11"  (1.499 m)   Wt 46.8 kg (103 lb 3.2 oz)   SpO2 95%   BMI 20.84 kg/m    General:  NAD, less jaundice  Cardiovascular: RRR  Respiratory: CTABL  Abdomen: Soft/ND/NT, positive BS, drain in place  Musculoskeletal: No Edema  Neuro: alert, oriented  Discharge Instructions You were cared for by a hospitalist during your hospital stay. If you have any questions about your discharge medications or the care you received while you were in the hospital after you are discharged, you can call the unit and asked to speak with the hospitalist on call if the hospitalist that took care of you is not available. Once you are discharged, your primary care physician will handle any further medical  issues. Please note that NO REFILLS for any discharge medications will be authorized once you are discharged, as it is imperative that you return to your primary care physician (or establish a relationship with a primary care physician if you do not have one) for your aftercare needs so that they can reassess your need for medications and monitor your lab values.  Discharge Instructions    Diet general   Complete by:  As directed    Soft diet, advance diet as tolerated   Discharge instructions   Complete by:  As directed    Replace bandage as needed to keep drain site clean and dry.  Flush drain with 5-10 mL of sterile saline daily.  Empty collection bag as needed.   Increase activity slowly   Complete by:  As directed      Allergies as of 11/12/2017   No Known Allergies     Medication List    TAKE these medications   ondansetron 4 MG tablet Commonly known as:  ZOFRAN Take 1 tablet (4 mg total) by mouth every 6 (six) hours as needed for nausea.   SYSTANE OP Place 1 drop into both eyes 3 (three) times daily as needed (for dry eyes).   VITAMIN D3 PO Take 1 capsule by mouth daily.      No Known Allergies Follow-up Information    Truitt Merle, MD Follow up on 11/14/2017.   Specialties:  Hematology, Oncology Contact information: [place] [address] [phone]        Greggory Keen, MD Follow up in 1 week(s).   Specialties:  Interventional Radiology, Radiology Why:  Follow-up at [facility] for possible internalization of biliary drain. You will hear from schedulers.  Contact information: [address] [place] [address] [phone]        Burnard Bunting, MD Follow up.   Specialty:  Internal Medicine Contact information: [address] [place] [address] [phone]        Irene Shipper, MD Follow up.   Specialty:  Gastroenterology Why:  as needed  Contact information: [address] [place] [address] [phone]            The results of significant diagnostics from this hospitalization (including imaging, microbiology, ancillary and laboratory) are listed below for reference.    Significant Diagnostic Studies: Ct Abdomen Pelvis W Contrast  Result Date: 11/03/2017 CLINICAL DATA:  Abdominal pain, weight loss and jaundice. EXAM: CT ABDOMEN AND PELVIS WITH CONTRAST TECHNIQUE: Multidetector CT imaging of the abdomen and pelvis was performed using the standard protocol following bolus administration of intravenous contrast. CONTRAST:  100 cc of Isovue-300 COMPARISON:  None. FINDINGS: Lower chest: No pleural effusion. Numerous pulmonary nodules are identified throughout both lungs. Index nodule in the right lower lobe measures 1 cm, image 4 of series 3. Index nodule in the left lower lobe measures 7 mm, image 9 of series 3. Hepatobiliary: Moderate to marked intrahepatic bile duct dilatation. No focal liver abnormality identified to suggest metastatic disease.  The proximal common bile duct measures 1.4 cm, image 25 of series 2. There is an abrupt cut off at the level of the mid common bile duct by pancreas mass. Pancreas: There is a large mass centered around the expected location of the head of pancreas which measures 3.8 by 2.6 by 4.1 cm. There is surrounding soft tissue infiltration which encases and nearly completely occludes the portal vein and portal venous confluence. Tumor extends into the aortocaval region and encases both the celiac and superior mesenteric arteries. Atrophy of the body and tail of pancreas noted. Spleen: The spleen is measures 6.9 x 7.8 x 13.3 cm (volume = 370 cm^3) Adrenals/Urinary Tract: Normal appearance of the adrenal glands. No kidney mass or hydronephrosis. Small left lower pole cyst measures 8 mm. Urinary bladder appears normal. Stomach/Bowel: The stomach appears normal. Tumor has mass effect upon the medial wall of the descending duodenum.  Cannot rule out invasion. No gastric outlet obstruction. No abnormal small bowel dilatation identified. The appendix is visualized and is normal. Normal appearance of the colon. Vascular/Lymphatic: Mild aortic atherosclerosis. Tumor encasement of the celiac and superior mesenteric arteries noted. There is also encasement and significant narrowing of the portal venous confluence and extrahepatic portal vein. Dilated intrahepatic portal vein is non thrombosed. Splenic vein remains patent. Small distal esophageal varices and multiple upper abdominal collateral vessels identified. Soft tissue infiltration within the proximal aortocaval region measures 1.4 by 2.9 cm. No pelvic or inguinal adenopathy. Reproductive: The uterus appears atrophic. No adnexal mass identified. Other: No ascites.  No peritoneal nodularity identified. Musculoskeletal: Spondylosis identified within the lumbar spine. There is an anterolisthesis of L4 on L5. No suspicious bone lesions. IMPRESSION: 1. Large, aggressive appearing mass involving the head of pancreas is identified with infiltration into the surrounding soft tissues. There is evidence of vascular involvement the portal venous confluence, extrahepatic portal vein, celiac trunk and superior mesenteric artery. Mass completely occludes the common bile duct resulting in marked intrahepatic biliary dilatation. There is also mass effect upon the medial wall of the descending duodenum. Cannot rule out invasion of the duodenum. 2. Numerous bilateral lower lobe pulmonary nodules compatible with metastatic disease. Electronically Signed   By: Kerby Moors M.D.   On: 11/03/2017 10:46   Dg Ercp Biliary & Pancreatic Ducts  Result Date: 11/09/2017 CLINICAL DATA:  Pancreatic mass.  Attempted ERCP. EXAM: ERCP TECHNIQUE: Multiple spot images obtained with the fluoroscopic device and submitted for interpretation post-procedure. COMPARISON:  CT abdomen and pelvis - 11/03/2017 FLUOROSCOPY TIME:  13  minutes, 43 seconds FINDINGS: 3 spot intraoperative fluoroscopic images of the right upper abdominal quadrant during ERCP are provided for review. Initial image demonstrates an ERCP probe overlying the right upper abdominal quadrant. There is passage of a wire through the CBD and into the origin of the cystic duct with faint contrast opacification. Subsequent images demonstrate faint opacification the distal aspect of the CBD as well as the pancreatic duct. There is no significant opacification intrahepatic biliary system. No are discrete filling defects within the opacified portions of the biliary system. IMPRESSION: ERCP as above. These images were submitted for radiologic interpretation only. Please see the procedural report for the amount of contrast and the fluoroscopy time utilized. Electronically Signed   By: Sandi Mariscal M.D.   On: 11/09/2017 14:34   Ir Biliary Drain Placement With Cholangiogram  Result Date: 11/10/2017 INDICATION: Pancreas mass, biliary obstruction, jaundice EXAM: ULTRASOUND AND FLUOROSCOPIC RIGHT EXTERNAL ONLY 10 FRENCH BILIARY DRAIN MEDICATIONS: 3.375 G ZOSYN;  The antibiotic was administered within an appropriate time frame prior to the initiation of the procedure. ANESTHESIA/SEDATION: Moderate (conscious) sedation was employed during this procedure. A total of Versed 3 mg and Fentanyl 150 mcg was administered intravenously. Moderate Sedation Time: 47 minutes. The patient's level of consciousness and vital signs were monitored continuously by radiology nursing throughout the procedure under my direct supervision. FLUOROSCOPY TIME:  Fluoroscopy Time: 11 minutes 54 seconds (101 mGy). COMPLICATIONS: None immediate. PROCEDURE: Informed written consent was obtained from the patient after a thorough discussion of the procedural risks, benefits and alternatives. All questions were addressed. Maximal Sterile Barrier Technique was utilized including caps, mask, sterile gowns, sterile gloves,  sterile drape, hand hygiene and skin antiseptic. A timeout was performed prior to the initiation of the procedure. Preliminary ultrasound performed. Dilated right hepatic ducts demonstrated. Lower intercostal space marked. Under sterile conditions and local anesthesia, a 21 gauge access needle was advanced percutaneously to a peripheral right hepatic duct. Contrast injection performed for cholangiogram. Diffuse biliary dilatation noted. Distal CBD obstruction evident. Eventually a 018 guidewire advanced into the biliary tree. Accustick dilator set advanced. Amplatz guidewire exchange performed. Kumpe catheter and Glidewire were utilized to attempt manipulation past the biliary obstruction however this was unsuccessful related to the marked dilatation of the CBD. Amplatz guidewire placed to insert a [place] catheter. Retention loop formed in the obstructed dilated common bile duct. Ducts were decompressed by syringe aspiration. Catheter secured with a Prolene suture and connected to external gravity drainage bag. Sterile dressing applied. No immediate complication. Patient tolerated the procedure well. IMPRESSION: Successful ultrasound and fluoroscopic 10 French right external biliary drain insertion. Bile culture sent. Electronically Signed   By: Jerilynn Mages.  Shick M.D.   On: 11/10/2017 15:42    Microbiology: Recent Results (from the past 240 hour(s))  Aerobic/Anaerobic Culture (surgical/deep wound)     Status: None (Preliminary result)   Collection Time: 11/10/17  3:32 PM  Result Value Ref Range Status   Specimen Description   Final    BILE Performed at [facility], [place] [address] [place], [place] [address]    Special Requests   Final    Normal Performed at [facility], [place] [address] [place], [place] [address]    Gram Stain NO WBC SEEN NO ORGANISMS SEEN   Final   Culture   Final    NO GROWTH 1 DAY Performed at [facility], [place]  [address], [place], [place] [address]    Report Status PENDING  Incomplete     Labs: Basic Metabolic Panel: Recent Labs  Lab 11/10/17 0353 11/11/17 0431 11/12/17 0502  NA 135 135 135  K 4.2 4.2 4.3  CL 103 103 101  CO2 22 24 26   GLUCOSE 140* 110* 117*  BUN 16 15 14   CREATININE 0.79 0.95 0.82  CALCIUM 9.0 8.8* 8.6*  MG  --  1.6* 1.8   Liver Function Tests: Recent Labs  Lab 11/09/17 1134 11/10/17 0353 11/11/17 0431 11/12/17 0502  AST 138* 113* 108* 92*  ALT 112* 89* 88* 82*  ALKPHOS 404* 313* 299* 267*  BILITOT 14.8* 13.0* 10.0* 8.9*  PROT 7.9 6.4* 6.6 6.5  ALBUMIN 3.7 2.9* 3.0* 2.9*   No results for input(s): LIPASE, AMYLASE in the last 168 hours. No results for input(s): AMMONIA in the last 168 hours. CBC: Recent Labs  Lab 11/10/17 0353 11/11/17 0431 11/12/17 0502  WBC 6.2 5.7 5.0  NEUTROABS  --  4.7  3.9  HGB 10.1* 10.5* 10.1*  HCT 29.9* 32.3* 31.0*  MCV 103.5* 106.3* 106.5*  PLT 178 169 148*   Cardiac Enzymes: No results for input(s): CKTOTAL, CKMB, CKMBINDEX, TROPONINI in the last 168 hours. BNP: BNP (last 3 results) No results for input(s): BNP in the last 8760 hours.  ProBNP (last 3 results) No results for input(s): PROBNP in the last 8760 hours.  CBG: No results for input(s): GLUCAP in the last 168 hours.     Signed:  Florencia Reasons MD, PhD  [facility] 11/12/2017, 9:11 AM

## 2017-11-12 NOTE — Care Management Note (Signed)
Case Management Note  Patient Details  Name: Elizabeth Kidd MRN: [id number] Date of Birth: 1946-08-16  Subjective/Objective:   Obstructive jaundice s/t Pancreatic mass,                  Action/Plan:  Spoke to pt and offered choice for HH/list provided. Pt requested [facility] for HH. Contacted [facility] with new referral. Lives at home independently.     Expected Discharge Date:  11/12/17               Expected Discharge Plan:  [place]  In-House Referral:  NA  Discharge planning Services  CM Consult  Post Acute Care Choice:  Home Health Choice offered to:  Patient  DME Arranged:  N/A DME Agency:  NA  HH Arranged:  RN [place] Agency:  [place]  Status of Service:  Completed, signed off  If discussed at [place] of Stay Meetings, dates discussed:    Additional Comments:  Erenest Rasher, RN 11/12/2017, 10:30 AM

## 2017-11-13 NOTE — Progress Notes (Signed)
  Oncology Nurse Navigator Documentation  Navigator Location: [facility] [address] (11/13/17 1021) Referral date to RadOnc/MedOnc: 11/13/17 (11/13/17 1021) )Navigator Encounter Type: Introductory phone call (11/13/17 1021)  called to introduce myself and my role as GI navigator.  Abnormal Finding Date: 11/03/17 (11/13/17 1021) Confirmed Diagnosis Date: 11/09/17 (11/13/17 1021)                 Treatment Phase: Pre-Tx/Tx Discussion (11/13/17 1021) Barriers/Navigation Needs: Education (11/13/17 1021) Education: Newly Diagnosed Cancer Education (11/13/17 1021) Interventions: Education (11/13/17 1021)  Discussed side effects of Pancreatic cancer. Patient feels full after just a few bites.      Education Method: Verbal (11/13/17 1021)      Acuity: Level 2 (11/13/17 1021)   Acuity Level 2: Initial guidance, education and coordination as needed;Educational needs (11/13/17 1021)     Time Spent with Patient: 30 (11/13/17 1021)

## 2017-11-14 ENCOUNTER — Encounter: Payer: Self-pay | Admitting: Hematology

## 2017-11-14 ENCOUNTER — Inpatient Hospital Stay: Payer: Medicare Other | Attending: Hematology | Admitting: Hematology

## 2017-11-14 VITALS — BP 135/62 | HR 92 | Temp 98.7°F | Resp 16 | Ht 59.0 in | Wt 104.3 lb

## 2017-11-14 DIAGNOSIS — D490 Neoplasm of unspecified behavior of digestive system: Secondary | ICD-10-CM

## 2017-11-14 DIAGNOSIS — R911 Solitary pulmonary nodule: Secondary | ICD-10-CM | POA: Insufficient documentation

## 2017-11-14 DIAGNOSIS — Z7189 Other specified counseling: Secondary | ICD-10-CM | POA: Insufficient documentation

## 2017-11-14 DIAGNOSIS — K831 Obstruction of bile duct: Secondary | ICD-10-CM | POA: Insufficient documentation

## 2017-11-14 DIAGNOSIS — C250 Malignant neoplasm of head of pancreas: Secondary | ICD-10-CM | POA: Diagnosis present

## 2017-11-14 DIAGNOSIS — R740 Nonspecific elevation of levels of transaminase and lactic acid dehydrogenase [LDH]: Secondary | ICD-10-CM | POA: Diagnosis not present

## 2017-11-14 DIAGNOSIS — R110 Nausea: Secondary | ICD-10-CM | POA: Insufficient documentation

## 2017-11-14 DIAGNOSIS — C7800 Secondary malignant neoplasm of unspecified lung: Secondary | ICD-10-CM

## 2017-11-14 DIAGNOSIS — D630 Anemia in neoplastic disease: Secondary | ICD-10-CM | POA: Insufficient documentation

## 2017-11-14 DIAGNOSIS — R599 Enlarged lymph nodes, unspecified: Secondary | ICD-10-CM | POA: Insufficient documentation

## 2017-11-14 DIAGNOSIS — C259 Malignant neoplasm of pancreas, unspecified: Secondary | ICD-10-CM | POA: Insufficient documentation

## 2017-11-14 DIAGNOSIS — Z803 Family history of malignant neoplasm of breast: Secondary | ICD-10-CM | POA: Diagnosis not present

## 2017-11-14 MED ORDER — PROCHLORPERAZINE MALEATE 10 MG PO TABS: 10.0000 mg | ORAL_TABLET | Freq: Four times a day (QID) | ORAL | 1 refills | Status: DC | PRN

## 2017-11-14 MED ORDER — TRAMADOL HCL 50 MG PO TABS: 50.0000 mg | ORAL_TABLET | Freq: Four times a day (QID) | ORAL | 0 refills | Status: DC | PRN

## 2017-11-14 NOTE — Progress Notes (Signed)
[facility]  [phone] Fax:([phone]) [place] Note   Patient Care Team: Burnard Bunting, MD as PCP - General (Internal Medicine) 11/14/2017  REFERRAL PHYSICIAN: Hospitalist Dr. Erlinda Hong   CHIEF COMPLAINTS/PURPOSE OF CONSULTATION:  Newly diagnosed metastatic pancreatic cancer  HISTORY OF PRESENTING ILLNESS:  Elizabeth Kidd 72 y.o. female is here because of her recently diagnosed metastatic pancreatic cancer.  She presents to my clinic with her friend today.  She has been in good health until 2 months ago, she developed nausea and abdominal discomfort after eating, and constipation.  She was seen by her PCP's APP in the office in December 2018, with treated for constipation by changing her diet and tried miralax, without improvement.  Follow-up visit, she had lab, x-ray etc which were all unremarkable.  She was then referred to Dr. Henrene Pastor on 10/31/17, was found to have jaudice, CT scan was obtained on 11/03/17, which showed a large mass in the head of pancreas, with obstructing CBD.  She was admitted on 11/09/2017, ERCP and stent was attempted but failed, she underwent percutaneous biliary draining by interventional radiology.  She was subsequent discharged home 2 days ago.  Since her hospital discharge, she still has mild nausea, but improved overall, she has been empty her biliary draining bag 3-4 times a day, 100-150cc each time, she is able to drink fluids adequately. She reports soreness at the dainage site , her appetite is low, eats soup, yougert, able to tolerate, she has lost about 10 lbs in the past few months. No chest pain or cough, she is able to function well at home. She lives alone, walks her dog, no children, has a Psychiatrist in [facility], she has many friends from her church in the neighborhood.   MEDICAL HISTORY:  Past Medical History:  Diagnosis Date  . Complication of anesthesia   . IBS (irritable bowel syndrome)   . Osteoarthritis   .  Osteopenia   . PONV (postoperative nausea and vomiting)   . Raynaud's disease     SURGICAL HISTORY: Past Surgical History:  Procedure Laterality Date  . BREAST LUMPECTOMY     fibroid tumor benign 1987  . DILATION AND CURETTAGE OF UTERUS    . ENDOSCOPIC RETROGRADE CHOLANGIOPANCREATOGRAPHY (ERCP) WITH PROPOFOL N/A 11/09/2017   Procedure: ENDOSCOPIC RETROGRADE CHOLANGIOPANCREATOGRAPHY (ERCP) WITH PROPOFOL;  Surgeon: Milus Banister, MD;  Location: [facility];  Service: Endoscopy;  Laterality: N/A;  . EUS N/A 11/09/2017   Procedure: UPPER ENDOSCOPIC ULTRASOUND (EUS) LINEAR;  Surgeon: Milus Banister, MD;  Location: [facility];  Service: Endoscopy;  Laterality: N/A;  . IR BILIARY DRAIN PLACEMENT WITH CHOLANGIOGRAM  11/10/2017    SOCIAL HISTORY: Social History   Socioeconomic History  . Marital status: Widowed    Spouse name: Not on file  . Number of children: Not on file  . Years of education: Not on file  . Highest education level: Not on file  Social Needs  . Financial resource strain: Not on file  . Food insecurity - worry: Not on file  . Food insecurity - inability: Not on file  . Transportation needs - medical: Not on file  . Transportation needs - non-medical: Not on file  Occupational History  . Not on file  Tobacco Use  . Smoking status: Never Smoker  . Smokeless tobacco: Never Used  Substance and Sexual Activity  . Alcohol use: No  . Drug use: No  . Sexual activity: Not on file  Other Topics Concern  .  Not on file  Social History Narrative  . Not on file    FAMILY HISTORY: Family History  Problem Relation Age of Onset  . Breast cancer Mother   . Colon cancer Neg Hx   . Pancreatic cancer Neg Hx   . Stomach cancer Neg Hx   . Rectal cancer Neg Hx     ALLERGIES:  has No Known Allergies.  MEDICATIONS:  Current Outpatient Medications  Medication Sig Dispense Refill  . Cholecalciferol (VITAMIN D3 PO) Take 1 capsule by mouth daily.    Marland Kitchen ibuprofen  (ADVIL,MOTRIN) 200 MG tablet Take 200 mg by mouth every 6 (six) hours as needed.    Vladimir Faster Glycol-Propyl Glycol (SYSTANE OP) Place 1 drop into both eyes 3 (three) times daily as needed (for dry eyes).    . ondansetron (ZOFRAN) 4 MG tablet Take 1 tablet (4 mg total) by mouth every 6 (six) hours as needed for nausea. (Patient not taking: Reported on 11/14/2017) 20 tablet 0   No current facility-administered medications for this visit.     REVIEW OF SYSTEMS:   Constitutional: Denies fevers, chills or abnormal night sweats, (+) fatigue and weight loss Eyes: Denies blurriness of vision, double vision or watery eyes Respiratory: Denies cough, dyspnea or wheezes Cardiovascular: Denies palpitation, chest discomfort or lower extremity swelling Gastrointestinal:  Denies nausea, heartburn or change in bowel habits Skin: Denies abnormal skin rashes (+) jaundice Lymphatics: Denies new lymphadenopathy or easy bruising Neurological:Denies numbness, tingling or new weaknesses Behavioral/Psych: Mood is stable, no new changes  All other systems were reviewed with the patient and are negative.  PHYSICAL EXAMINATION: ECOG PERFORMANCE STATUS: 1 - Symptomatic but completely ambulatory  Vitals:   11/14/17 1444  BP: 135/62  Pulse: 92  Resp: 16  Temp: 98.7 F (37.1 C)  SpO2: 98%   Filed Weights   11/14/17 1444  Weight: 104 lb 4.8 oz (47.3 kg)    GENERAL:alert, no distress and comfortable SKIN: skin color, texture, turgor are normal, no rashes or significant lesions except (+) jaundice OROPHARYNX:no exudate, no erythema and lips, buccal mucosa, and tongue normal  NECK: supple, thyroid normal size, non-tender, without nodularity LYMPH:  no palpable lymphadenopathy in the cervical, axillary or inguinal LUNGS: clear to auscultation and percussion with normal breathing effort HEART: regular rate & rhythm and no murmurs and no lower extremity edema ABDOMEN: (+) Biliary drainage tube in the right  upper quadrant, covered by gauze, abdomen soft, non-tender and normal bowel sounds Musculoskeletal:no cyanosis of digits and no clubbing  PSYCH: alert & oriented x 3 with fluent speech NEURO: no focal motor/sensory deficits  LABORATORY DATA:  I have reviewed the data as listed CBC Latest Ref Rng & Units 11/12/2017 11/11/2017 11/10/2017  WBC 4.0 - 10.5 K/uL 5.0 5.7 6.2  Hemoglobin 12.0 - 15.0 g/dL 10.1(L) 10.5(L) 10.1(L)  Hematocrit 36.0 - 46.0 % 31.0(L) 32.3(L) 29.9(L)  Platelets 150 - 400 K/uL 148(L) 169 178    CMP Latest Ref Rng & Units 11/12/2017 11/11/2017 11/10/2017  Glucose 65 - 99 mg/dL 117(H) 110(H) 140(H)  BUN 6 - 20 mg/dL '14 15 16  '$ Creatinine 0.44 - 1.00 mg/dL 0.82 0.95 0.79  Sodium 135 - 145 mmol/L 135 135 135  Potassium 3.5 - 5.1 mmol/L 4.3 4.2 4.2  Chloride 101 - 111 mmol/L 101 103 103  CO2 22 - 32 mmol/L '26 24 22  '$ Calcium 8.9 - 10.3 mg/dL 8.6(L) 8.8(L) 9.0  Total Protein 6.5 - 8.1 g/dL 6.5 6.6 6.4(L)  Total Bilirubin  0.3 - 1.2 mg/dL 8.9(H) 10.0(H) 13.0(H)  Alkaline Phos 38 - 126 U/L 267(H) 299(H) 313(H)  AST 15 - 41 U/L 92(H) 108(H) 113(H)  ALT 14 - 54 U/L 82(H) 88(H) 89(H)     RADIOGRAPHIC STUDIES: I have personally reviewed the radiological images as listed and agreed with the findings in the report. Ct Abdomen Pelvis W Contrast  Result Date: 11/03/2017 CLINICAL DATA:  Abdominal pain, weight loss and jaundice. EXAM: CT ABDOMEN AND PELVIS WITH CONTRAST TECHNIQUE: Multidetector CT imaging of the abdomen and pelvis was performed using the standard protocol following bolus administration of intravenous contrast. CONTRAST:  100 cc of Isovue-300 COMPARISON:  None. FINDINGS: Lower chest: No pleural effusion. Numerous pulmonary nodules are identified throughout both lungs. Index nodule in the right lower lobe measures 1 cm, image 4 of series 3. Index nodule in the left lower lobe measures 7 mm, image 9 of series 3. Hepatobiliary: Moderate to marked intrahepatic bile duct  dilatation. No focal liver abnormality identified to suggest metastatic disease. The proximal common bile duct measures 1.4 cm, image 25 of series 2. There is an abrupt cut off at the level of the mid common bile duct by pancreas mass. Pancreas: There is a large mass centered around the expected location of the head of pancreas which measures 3.8 by 2.6 by 4.1 cm. There is surrounding soft tissue infiltration which encases and nearly completely occludes the portal vein and portal venous confluence. Tumor extends into the aortocaval region and encases both the celiac and superior mesenteric arteries. Atrophy of the body and tail of pancreas noted. Spleen: The spleen is measures 6.9 x 7.8 x 13.3 cm (volume = 370 cm^3) Adrenals/Urinary Tract: Normal appearance of the adrenal glands. No kidney mass or hydronephrosis. Small left lower pole cyst measures 8 mm. Urinary bladder appears normal. Stomach/Bowel: The stomach appears normal. Tumor has mass effect upon the medial wall of the descending duodenum. Cannot rule out invasion. No gastric outlet obstruction. No abnormal small bowel dilatation identified. The appendix is visualized and is normal. Normal appearance of the colon. Vascular/Lymphatic: Mild aortic atherosclerosis. Tumor encasement of the celiac and superior mesenteric arteries noted. There is also encasement and significant narrowing of the portal venous confluence and extrahepatic portal vein. Dilated intrahepatic portal vein is non thrombosed. Splenic vein remains patent. Small distal esophageal varices and multiple upper abdominal collateral vessels identified. Soft tissue infiltration within the proximal aortocaval region measures 1.4 by 2.9 cm. No pelvic or inguinal adenopathy. Reproductive: The uterus appears atrophic. No adnexal mass identified. Other: No ascites.  No peritoneal nodularity identified. Musculoskeletal: Spondylosis identified within the lumbar spine. There is an anterolisthesis of L4 on  L5. No suspicious bone lesions. IMPRESSION: 1. Large, aggressive appearing mass involving the head of pancreas is identified with infiltration into the surrounding soft tissues. There is evidence of vascular involvement the portal venous confluence, extrahepatic portal vein, celiac trunk and superior mesenteric artery. Mass completely occludes the common bile duct resulting in marked intrahepatic biliary dilatation. There is also mass effect upon the medial wall of the descending duodenum. Cannot rule out invasion of the duodenum. 2. Numerous bilateral lower lobe pulmonary nodules compatible with metastatic disease. Electronically Signed   By: Kerby Moors M.D.   On: 11/03/2017 10:46   Dg Ercp Biliary & Pancreatic Ducts  Result Date: 11/09/2017 CLINICAL DATA:  Pancreatic mass.  Attempted ERCP. EXAM: ERCP TECHNIQUE: Multiple spot images obtained with the fluoroscopic device and submitted for interpretation post-procedure. COMPARISON:  CT abdomen  and pelvis - 11/03/2017 FLUOROSCOPY TIME:  13 minutes, 43 seconds FINDINGS: 3 spot intraoperative fluoroscopic images of the right upper abdominal quadrant during ERCP are provided for review. Initial image demonstrates an ERCP probe overlying the right upper abdominal quadrant. There is passage of a wire through the CBD and into the origin of the cystic duct with faint contrast opacification. Subsequent images demonstrate faint opacification the distal aspect of the CBD as well as the pancreatic duct. There is no significant opacification intrahepatic biliary system. No are discrete filling defects within the opacified portions of the biliary system. IMPRESSION: ERCP as above. These images were submitted for radiologic interpretation only. Please see the procedural report for the amount of contrast and the fluoroscopy time utilized. Electronically Signed   By: Sandi Mariscal M.D.   On: 11/09/2017 14:34   Ir Biliary Drain Placement With Cholangiogram  Result Date:  11/10/2017 INDICATION: Pancreas mass, biliary obstruction, jaundice EXAM: ULTRASOUND AND FLUOROSCOPIC RIGHT EXTERNAL ONLY 10 FRENCH BILIARY DRAIN MEDICATIONS: 3.375 G ZOSYN; The antibiotic was administered within an appropriate time frame prior to the initiation of the procedure. ANESTHESIA/SEDATION: Moderate (conscious) sedation was employed during this procedure. A total of Versed 3 mg and Fentanyl 150 mcg was administered intravenously. Moderate Sedation Time: 47 minutes. The patient's level of consciousness and vital signs were monitored continuously by radiology nursing throughout the procedure under my direct supervision. FLUOROSCOPY TIME:  Fluoroscopy Time: 11 minutes 54 seconds (101 mGy). COMPLICATIONS: None immediate. PROCEDURE: Informed written consent was obtained from the patient after a thorough discussion of the procedural risks, benefits and alternatives. All questions were addressed. Maximal Sterile Barrier Technique was utilized including caps, mask, sterile gowns, sterile gloves, sterile drape, hand hygiene and skin antiseptic. A timeout was performed prior to the initiation of the procedure. Preliminary ultrasound performed. Dilated right hepatic ducts demonstrated. Lower intercostal space marked. Under sterile conditions and local anesthesia, a 21 gauge access needle was advanced percutaneously to a peripheral right hepatic duct. Contrast injection performed for cholangiogram. Diffuse biliary dilatation noted. Distal CBD obstruction evident. Eventually a 018 guidewire advanced into the biliary tree. Accustick dilator set advanced. Amplatz guidewire exchange performed. Kumpe catheter and Glidewire were utilized to attempt manipulation past the biliary obstruction however this was unsuccessful related to the marked dilatation of the CBD. Amplatz guidewire placed to insert a Oberlin catheter. Retention loop formed in the obstructed dilated common bile duct. Ducts were decompressed  by syringe aspiration. Catheter secured with a Prolene suture and connected to external gravity drainage bag. Sterile dressing applied. No immediate complication. Patient tolerated the procedure well. IMPRESSION: Successful ultrasound and fluoroscopic 10 French right external biliary drain insertion. Bile culture sent. Electronically Signed   By: Jerilynn Mages.  Shick M.D.   On: 11/10/2017 15:42    ASSESSMENT & PLAN: 72 year old Caucasian female, presented with epigastric pain aft meals, painless jaundice, low appetite and 10 pound weight loss.  1.  Pancreatic head adenocarcinoma, [id number], with lung and node metastasis  -I have reviewed her image findings, biopsy results with patient and her friend in details. -Her CT scan images were reviewed and explained to her in person.  She has a 4.1 cm mass in the head of the pancreas, with vascular invasion, unresectable.  Unfortunately she has numerous lung nodules, largest 1 cm, this is most consistent with metastatic pancreatic cancer. -I recommend a CT chest without contrast to complete his staging -Given her unresectable primary, pretty convincing image findings in her chest, I do not  think we need to biopsy to confirm the metastasis. -I discussed the aggressive nature of pancreatic cancer, and incurable nature of her metastatic disease.  We discussed the median survival is about 4 months without treatment -I discussed treatment options, mainly systemic chemotherapy.  The different options of therapy including single agent gemcitabine, gemcitabine and Abraxane, Lovenox, etc. were discussed with her in details. -Due to her advanced age, and her limited social support, I recommend her to consider single agent gemcitabine for first cycle, if she tolerates well, will add Abraxane to her second cycle.  We discussed the therapy will be given weekly for 2 weeks, and one week off. --Chemotherapy consent: Side effects including but does not not limited to, fatigue, nausea,  vomiting, diarrhea, hair loss, neuropathy, fluid retention, renal and kidney dysfunction, neutropenic fever, needed for blood transfusion, bleeding, were discussed with patient in great detail. She agrees to proceed. -The goal of therapy is palliative, to prolong her life and improve her quality of life. -If her biopsy tissue is adequate, I will request MSI to see if she is a candidate, for immunotherapy.  I will also refer her to genetics, to see if she has BRCA1/2 mutations and a candidate for pump inhibitors.  -We also discussed the option of palliative care alone, and hospice.  Her husband died of small cell lung cancer, she is familiar with cancer treatment and hospice. -After lengthy discussion, she expressed her wish to try chemotherapy.  -Due to her significant jaundice, I will wait for 2 more weeks for her bilirubin to improve, so she cannot tolerate chemotherapy better.  2.  Obstructive jaundice and transaminitis, status post percutaneous biliary drain placement -she is scheduled to see IR for internalization and stent placement next Monday, March 4 -I encouraged her to drink fluids adequately, to avoid dehydration  3. Anemia  -Secondary to her underlying malignancy -M85 and folic acid level were normal -I will also check her iron study  4. Goal of care discussion  -We again discussed the incurable nature of her cancer, and the overall poor prognosis, especially if she does not have good response to chemotherapy or progress on chemo -The patient understands the goal of care is palliative. -I recommend DNR/DNI, she will think about it    Plan -CT chest without contrast in the next week -Lab in a chemo class in the next week -Lab and gemcitabine weeklyX2, starting late next week    No orders of the defined types were placed in this encounter.   All questions were answered. The patient knows to call the clinic with any problems, questions or concerns. I spent 55 minutes  counseling the patient face to face. The total time spent in the appointment was 60 minutes and more than 50% was on counseling.     Truitt Merle, MD 11/14/2017 3:16 PM

## 2017-11-14 NOTE — Addendum Note (Signed)
Addended by: Truitt Merle on: 11/14/2017 09:51 PM   Modules accepted: Orders

## 2017-11-15 ENCOUNTER — Telehealth: Payer: Self-pay | Admitting: Hematology

## 2017-11-15 LAB — AEROBIC/ANAEROBIC CULTURE W GRAM STAIN (SURGICAL/DEEP WOUND)
Culture: NO GROWTH
Gram Stain: NONE SEEN

## 2017-11-15 LAB — AEROBIC/ANAEROBIC CULTURE (SURGICAL/DEEP WOUND): SPECIAL REQUESTS: NORMAL

## 2017-11-15 NOTE — Telephone Encounter (Signed)
Called patient in regards to 2/26 sch msg that stated she needs Nutrition Appointment.  She was given Date/Time .

## 2017-11-15 NOTE — Progress Notes (Signed)
  Oncology Nurse Navigator Documentation  Navigator Location: [facility] (11/15/17 1259)   )Navigator Encounter Type: Telephone (11/15/17 1259) Telephone: Lahoma Crocker Call;Appt Confirmation/Clarification (11/15/17 1259)    Called patient to let her know that her CT Scan is scheduled for 11/22/17 @ 1 PM at [facility]. Patient to arrive at 12:45. Patient verbalized understanding.                 Treatment Phase: Pre-Tx/Tx Discussion (11/15/17 1259)     Interventions: Coordination of Care (11/15/17 1259)   Coordination of Care: Appts(CT Scan apt ) (11/15/17 1259)        Acuity: Level 2 (11/15/17 1259)         Time Spent with Patient: 15 (11/15/17 1259)

## 2017-11-17 ENCOUNTER — Encounter: Payer: Self-pay | Admitting: General Practice

## 2017-11-17 NOTE — Progress Notes (Signed)
[facility] CSW Progress Note  Call to patient to offer support from [employer]. Processed logistical issues re upcoming surgery, CSW will look for options if neighbors do not agree to transport.  Home health did in home assessment, per patient, did not offer services but recommended hospice.  Patient lives alone, has neighbors/friends who may be willing to check on her.  Is widowed, step daughter lives out of state and arrives 3/7 to visit.  CSW informed [place] nurse navigator of about issues, will investigate options for support in the home and transport.  Requested [facility] referral be made.  Edwyna Shell, LCSW Clinical Social Worker Phone:  [phone]

## 2017-11-17 NOTE — Progress Notes (Signed)
  Oncology Nurse Navigator Documentation  Navigator Location: [facility] (11/17/17 1430)   )Navigator Encounter Type: Telephone (11/17/17 1430) Telephone: Lahoma Crocker Call;Appt Confirmation/Clarification (11/17/17 1430) Called patient to assess support for treatments moving forward. Patient states "I can't keep asking- I won't ask my friends to keep helping me."  I let patient know that I had placed a referral to [facility] and that Edwyna Shell LCSW would be communicating with [facility] to identify possibilities for assisting patient. Patient's step-daughter lives in [place] and will be in town to accompany patient to her first chemo treatment. Patient encouraged to call with questions or concerns.                         Education: Coping with Diagnosis/ Prognosis (11/17/17 1430) Interventions: Referrals (11/17/17 1430) Referrals: Other([facility]) (11/17/17 1430)          Acuity: Level 2 (11/17/17 1430)         Time Spent with Patient: 15 (11/17/17 1430)

## 2017-11-17 NOTE — Progress Notes (Signed)
[facility] CSW Progress Note  Patient given information on [employer] and encouraged to use for transport needs.  Advised that request for transport on Monday needs to be prioritized as "urgent medical need" which [employer] will verify w [place].  Pt given contact info for nurse navigator and CSW to verify this if needed.  CSW discussed options for in home care assistance/sitter if needed - pt can be given options for this if she needs help in the home.  CSW will mail patient information packet on options and Support Center services.  Edwyna Shell, LCSW Clinical Social Worker Phone:  [phone]

## 2017-11-20 ENCOUNTER — Encounter (HOSPITAL_COMMUNITY): Payer: Self-pay

## 2017-11-20 ENCOUNTER — Ambulatory Visit (HOSPITAL_COMMUNITY)
Admission: RE | Admit: 2017-11-20 | Discharge: 2017-11-20 | Disposition: A | Discharge status: Home / Self Care | Payer: Medicare Other | Source: Ambulatory Visit | Attending: Student | Admitting: Student

## 2017-11-20 ENCOUNTER — Other Ambulatory Visit: Payer: Self-pay

## 2017-11-20 ENCOUNTER — Other Ambulatory Visit: Payer: Self-pay | Admitting: *Deleted

## 2017-11-20 ENCOUNTER — Inpatient Hospital Stay (HOSPITAL_COMMUNITY)
Admission: EM | Admit: 2017-11-20 | Discharge: 2017-11-23 | DRG: 435 | Disposition: A | Discharge status: Hospice | Payer: Medicare Other | Attending: Family Medicine | Admitting: Family Medicine

## 2017-11-20 ENCOUNTER — Ambulatory Visit (HOSPITAL_COMMUNITY)
Admission: RE | Admit: 2017-11-20 | Discharge: 2017-11-20 | Disposition: A | Discharge status: Home / Self Care | Payer: Medicare Other | Source: Ambulatory Visit | Attending: Interventional Radiology | Admitting: Interventional Radiology

## 2017-11-20 DIAGNOSIS — M479 Spondylosis, unspecified: Secondary | ICD-10-CM | POA: Diagnosis present

## 2017-11-20 DIAGNOSIS — C801 Malignant (primary) neoplasm, unspecified: Secondary | ICD-10-CM | POA: Diagnosis present

## 2017-11-20 DIAGNOSIS — K831 Obstruction of bile duct: Principal | ICD-10-CM

## 2017-11-20 DIAGNOSIS — Z515 Encounter for palliative care: Secondary | ICD-10-CM | POA: Diagnosis not present

## 2017-11-20 DIAGNOSIS — J9690 Respiratory failure, unspecified, unspecified whether with hypoxia or hypercapnia: Secondary | ICD-10-CM | POA: Diagnosis not present

## 2017-11-20 DIAGNOSIS — K766 Portal hypertension: Secondary | ICD-10-CM | POA: Diagnosis present

## 2017-11-20 DIAGNOSIS — R110 Nausea: Secondary | ICD-10-CM

## 2017-11-20 DIAGNOSIS — Z7189 Other specified counseling: Secondary | ICD-10-CM | POA: Diagnosis not present

## 2017-11-20 DIAGNOSIS — K920 Hematemesis: Secondary | ICD-10-CM

## 2017-11-20 DIAGNOSIS — M8580 Other specified disorders of bone density and structure, unspecified site: Secondary | ICD-10-CM

## 2017-11-20 DIAGNOSIS — R000 Tachycardia, unspecified: Secondary | ICD-10-CM | POA: Diagnosis present

## 2017-11-20 DIAGNOSIS — I8500 Esophageal varices without bleeding: Secondary | ICD-10-CM | POA: Diagnosis present

## 2017-11-20 DIAGNOSIS — C7800 Secondary malignant neoplasm of unspecified lung: Secondary | ICD-10-CM | POA: Diagnosis present

## 2017-11-20 DIAGNOSIS — I7300 Raynaud's syndrome without gangrene: Secondary | ICD-10-CM

## 2017-11-20 DIAGNOSIS — K8681 Exocrine pancreatic insufficiency: Secondary | ICD-10-CM | POA: Diagnosis present

## 2017-11-20 DIAGNOSIS — Z66 Do not resuscitate: Secondary | ICD-10-CM | POA: Diagnosis present

## 2017-11-20 DIAGNOSIS — G9340 Encephalopathy, unspecified: Secondary | ICD-10-CM | POA: Diagnosis not present

## 2017-11-20 DIAGNOSIS — C784 Secondary malignant neoplasm of small intestine: Secondary | ICD-10-CM | POA: Diagnosis present

## 2017-11-20 DIAGNOSIS — C250 Malignant neoplasm of head of pancreas: Secondary | ICD-10-CM | POA: Diagnosis present

## 2017-11-20 DIAGNOSIS — M1990 Unspecified osteoarthritis, unspecified site: Secondary | ICD-10-CM | POA: Insufficient documentation

## 2017-11-20 DIAGNOSIS — K2971 Gastritis, unspecified, with bleeding: Secondary | ICD-10-CM | POA: Diagnosis not present

## 2017-11-20 DIAGNOSIS — C259 Malignant neoplasm of pancreas, unspecified: Secondary | ICD-10-CM | POA: Diagnosis not present

## 2017-11-20 DIAGNOSIS — Z5309 Procedure and treatment not carried out because of other contraindication: Secondary | ICD-10-CM | POA: Insufficient documentation

## 2017-11-20 DIAGNOSIS — Z888 Allergy status to other drugs, medicaments and biological substances status: Secondary | ICD-10-CM

## 2017-11-20 DIAGNOSIS — D509 Iron deficiency anemia, unspecified: Secondary | ICD-10-CM | POA: Diagnosis not present

## 2017-11-20 DIAGNOSIS — K869 Disease of pancreas, unspecified: Secondary | ICD-10-CM

## 2017-11-20 DIAGNOSIS — K589 Irritable bowel syndrome without diarrhea: Secondary | ICD-10-CM | POA: Diagnosis present

## 2017-11-20 DIAGNOSIS — Z79899 Other long term (current) drug therapy: Secondary | ICD-10-CM | POA: Diagnosis not present

## 2017-11-20 DIAGNOSIS — K922 Gastrointestinal hemorrhage, unspecified: Secondary | ICD-10-CM | POA: Diagnosis present

## 2017-11-20 DIAGNOSIS — D62 Acute posthemorrhagic anemia: Secondary | ICD-10-CM | POA: Diagnosis present

## 2017-11-20 DIAGNOSIS — D500 Iron deficiency anemia secondary to blood loss (chronic): Secondary | ICD-10-CM | POA: Diagnosis not present

## 2017-11-20 DIAGNOSIS — I700 Atherosclerosis of aorta: Secondary | ICD-10-CM | POA: Diagnosis present

## 2017-11-20 LAB — COMPREHENSIVE METABOLIC PANEL
ALBUMIN: 3.4 g/dL — AB (ref 3.5–5.0)
ALK PHOS: 173 U/L — AB (ref 38–126)
ALT: 133 U/L — AB (ref 14–54)
AST: 135 U/L — AB (ref 15–41)
Anion gap: 12 (ref 5–15)
BUN: 37 mg/dL — ABNORMAL HIGH (ref 6–20)
CO2: 22 mmol/L (ref 22–32)
CREATININE: 0.68 mg/dL (ref 0.44–1.00)
Calcium: 9.4 mg/dL (ref 8.9–10.3)
Chloride: 99 mmol/L — ABNORMAL LOW (ref 101–111)
GFR calc Af Amer: >60 mL/min (ref >60)
GFR calc non Af Amer: >60 mL/min (ref >60)
GLUCOSE: 110 mg/dL — AB (ref 65–99)
Potassium: 3.7 mmol/L (ref 3.5–5.1)
SODIUM: 133 mmol/L — AB (ref 135–145)
Total Bilirubin: 16.7 mg/dL — ABNORMAL HIGH (ref 0.3–1.2)
Total Protein: 7.5 g/dL (ref 6.5–8.1)

## 2017-11-20 LAB — PREPARE RBC (CROSSMATCH)

## 2017-11-20 LAB — PROTIME-INR
INR: 1.25
Prothrombin Time: 15.6 seconds — ABNORMAL HIGH (ref 11.4–15.2)

## 2017-11-20 LAB — CBC
HCT: 26.6 % — ABNORMAL LOW (ref 36.0–46.0)
HEMOGLOBIN: 9 g/dL — AB (ref 12.0–15.0)
MCH: 35.4 pg — AB (ref 26.0–34.0)
MCHC: 33.8 g/dL (ref 30.0–36.0)
MCV: 104.7 fL — ABNORMAL HIGH (ref 78.0–100.0)
PLATELETS: 313 10*3/uL (ref 150–400)
RBC: 2.54 MIL/uL — ABNORMAL LOW (ref 3.87–5.11)
RDW: 20.9 % — ABNORMAL HIGH (ref 11.5–15.5)
WBC: 12.3 10*3/uL — ABNORMAL HIGH (ref 4.0–10.5)

## 2017-11-20 LAB — APTT: aPTT: 33 seconds (ref 24–36)

## 2017-11-20 LAB — ABO/RH: ABO/RH(D): A NEG

## 2017-11-20 MED ORDER — SODIUM CHLORIDE 0.9 % IV SOLN
Freq: Once | INTRAVENOUS | Status: AC
  Administered 2017-11-20: 12:00:00 via INTRAVENOUS

## 2017-11-20 MED ORDER — SODIUM CHLORIDE 0.9 % IV BOLUS (SEPSIS)
500.0000 mL | Freq: Once | INTRAVENOUS | Status: AC
  Administered 2017-11-20: 500 mL via INTRAVENOUS

## 2017-11-20 MED ORDER — ONDANSETRON HCL 4 MG PO TABS: 4.0000 mg | ORAL_TABLET | Freq: Four times a day (QID) | ORAL | Status: DC | PRN

## 2017-11-20 MED ORDER — SODIUM CHLORIDE 0.9 % IV SOLN
INTRAVENOUS | Status: DC
  Administered 2017-11-20: 10:00:00 via INTRAVENOUS

## 2017-11-20 MED ORDER — OCTREOTIDE LOAD VIA INFUSION
100.0000 ug | Freq: Once | INTRAVENOUS | Status: AC
Start: 2017-11-20 — End: 2017-11-20
  Administered 2017-11-20: 100 ug via INTRAVENOUS
  Filled 2017-11-20: qty 50

## 2017-11-20 MED ORDER — SODIUM CHLORIDE 0.9% FLUSH: 3.0000 mL | INTRAVENOUS | Status: DC | PRN

## 2017-11-20 MED ORDER — SODIUM CHLORIDE 0.9 % IV SOLN
8.0000 mg/h | INTRAVENOUS | Status: DC
  Administered 2017-11-20 – 2017-11-22 (×6): 8 mg/h via INTRAVENOUS
  Filled 2017-11-20 (×11): qty 80

## 2017-11-20 MED ORDER — SODIUM CHLORIDE 0.9 % IV SOLN
50.0000 ug/h | INTRAVENOUS | Status: DC
  Administered 2017-11-20 – 2017-11-22 (×6): 50 ug/h via INTRAVENOUS
  Filled 2017-11-20 (×11): qty 1

## 2017-11-20 MED ORDER — ONDANSETRON HCL 4 MG/2ML IJ SOLN
4.0000 mg | Freq: Four times a day (QID) | INTRAMUSCULAR | Status: DC | PRN
  Administered 2017-11-20 – 2017-11-21 (×2): 4 mg via INTRAVENOUS
  Filled 2017-11-20 (×2): qty 2

## 2017-11-20 MED ORDER — SODIUM CHLORIDE 0.9% FLUSH
3.0000 mL | Freq: Two times a day (BID) | INTRAVENOUS | Status: DC
  Administered 2017-11-20 (×2): 3 mL via INTRAVENOUS

## 2017-11-20 MED ORDER — SODIUM CHLORIDE 0.9 % IV SOLN: 250.0000 mL | INTRAVENOUS | Status: DC | PRN

## 2017-11-20 MED ORDER — SODIUM CHLORIDE 0.9 % IV SOLN
80.0000 mg | Freq: Once | INTRAVENOUS | Status: AC
  Administered 2017-11-20: 80 mg via INTRAVENOUS
  Filled 2017-11-20: qty 80

## 2017-11-20 MED ORDER — ONDANSETRON HCL 4 MG/2ML IJ SOLN
4.0000 mg | Freq: Once | INTRAMUSCULAR | Status: AC
  Administered 2017-11-20: 4 mg via INTRAVENOUS
  Filled 2017-11-20: qty 2

## 2017-11-20 NOTE — H&P (View-Only) (Signed)
Consultation  Referring Provider:  Dr. Billy Fischer    Primary Care Physician:  Burnard Bunting, MD Primary Gastroenterologist:  Dr. Henrene Pastor       Reason for Consultation: Hematemesis            HPI:   Kasy Iannacone is a 72 y.o. female with a history of pancreatic cancer, who presented to the ER today from short stay with acute hematemesis.    Patient seen today by IR and short stay for internalization of her biliary drain to be re-attempted.  External biliary drain was placed 11/10/17.  Arrived denying complaints however began to complain of nausea and started vomiting a large quantity of bright red blood.  Upon furthering questioning admitted to bloody vomit several days ago which never recurred and some dark bowel movements.  Had been feeling lightheaded and dizzy.     Today, explains that on Friday, 3 days ago she did vomit one time and did see a small amount of bright red blood, but after that stopped vomiting and just continued with nausea.  She then did have one dark stool on Saturday 2 days ago, but has not had a stool since.  Continued with a large amount of nausea unhelped by antiemetics given by oncologist.  Admits to feeling a little dizzy.  This morning upon arriving to IR for hopeful internalization of biliary drain had further nausea and 2 big episodes of "a large amount of bright red blood" in her vomit.  She was then taken to the ER.  Last food intake was a half a cup of yogurt yesterday.  No fluids or food today.    Denies fever or chills.    ER course: Tachycardic in the 120s, BP 90-106/53-68, hemoglobin 9.0 this morning  GI history: 2/22-biliary drain placement by IR 11/09/17-EUS: Irregularly shaped, approximately 3.7 cm mass in the pancreatic head, preliminary cytology positive for adenocarcinoma, EUS and CT images show this mass encases, and invades the portal vein involves the celiac trunk, SMA (nonresectable), pulmonary nodule on abdominal CT suggest metastatic disease           -ERCP: Tight biliary stricture at the level of the common hepatic duct, cystic duct takeoff, unable to advance either wire or inject dye through the stricture; plan: Patient admitted for PTC biliary drain via IR 11/07/17-office visit, Dr. Henrene Pastor: A week ago had abdominal discomfort and Hemoccult positive stool, found to be jaundiced on physical exam, subsequently underwent laboratories which revealed abnormal liver tests with AST 84, ALT 81, alk phos 524, total bilirubin 9.4, hemoglobin 11.5, INR 1.2.  CT of the abdomen and pelvis performed 11/03/17 with a large aggressive appearing mass involving the head of the pancreas with infiltration into surrounding soft tissues with evidence of vascular involvement and biliary obstruction, mass-effect on the descending duodenum as well.  Numerous bilateral pulmonary nodules felt to possibly represent metastatic disease.;  Plan: Set up for EUS and ERCP February 21 and referred to medical oncologist  Past Medical History:  Diagnosis Date  . Complication of anesthesia   . IBS (irritable bowel syndrome)   . Osteoarthritis   . Osteopenia   . PONV (postoperative nausea and vomiting)   . Raynaud's disease     Past Surgical History:  Procedure Laterality Date  . BREAST LUMPECTOMY     fibroid tumor benign 1987  . DILATION AND CURETTAGE OF UTERUS    . ENDOSCOPIC RETROGRADE CHOLANGIOPANCREATOGRAPHY (ERCP) WITH PROPOFOL N/A 11/09/2017   Procedure: ENDOSCOPIC RETROGRADE CHOLANGIOPANCREATOGRAPHY (  ERCP) WITH PROPOFOL;  Surgeon: Milus Banister, MD;  Location: [facility];  Service: Endoscopy;  Laterality: N/A;  . EUS N/A 11/09/2017   Procedure: UPPER ENDOSCOPIC ULTRASOUND (EUS) LINEAR;  Surgeon: Milus Banister, MD;  Location: [facility];  Service: Endoscopy;  Laterality: N/A;  . IR BILIARY DRAIN PLACEMENT WITH CHOLANGIOGRAM  11/10/2017    Family History  Problem Relation Age of Onset  . Breast cancer Mother 17  . Lupus Sister   . Colon cancer Neg Hx     . Pancreatic cancer Neg Hx   . Stomach cancer Neg Hx   . Rectal cancer Neg Hx      Social History   Tobacco Use  . Smoking status: Never Smoker  . Smokeless tobacco: Never Used  Substance Use Topics  . Alcohol use: No  . Drug use: No    Prior to Admission medications   Medication Sig Start Date End Date Taking? Authorizing Provider  Cholecalciferol (VITAMIN D3 PO) Take 1 capsule by mouth daily.    [provider]  ibuprofen (ADVIL,MOTRIN) 200 MG tablet Take 200 mg by mouth every 6 (six) hours as needed.    [provider]  ondansetron (ZOFRAN) 4 MG tablet Take 1 tablet (4 mg total) by mouth every 6 (six) hours as needed for nausea. Patient not taking: Reported on 11/14/2017 11/12/17   Florencia Reasons, MD  Polyethyl Glycol-Propyl Glycol (SYSTANE OP) Place 1 drop into both eyes 3 (three) times daily as needed (for dry eyes).    [provider]  prochlorperazine (COMPAZINE) 10 MG tablet Take 1 tablet (10 mg total) by mouth every 6 (six) hours as needed (Nausea or vomiting). 11/14/17   Truitt Merle, MD  traMADol (ULTRAM) 50 MG tablet Take 1 tablet (50 mg total) by mouth every 6 (six) hours as needed. 11/14/17   Truitt Merle, MD    Current Facility-Administered Medications  Medication Dose Route Frequency Provider Last Rate Last Dose  . 0.9 %  sodium chloride infusion   Intravenous Once Gareth Morgan, MD      . octreotide (SANDOSTATIN) 2 mcg/mL load via infusion 100 mcg  100 mcg Intravenous Once Gareth Morgan, MD       And  . octreotide (SANDOSTATIN) 500 mcg in sodium chloride 0.9 % 250 mL (2 mcg/mL) infusion  50 mcg/hr Intravenous Continuous Gareth Morgan, MD      . pantoprazole (PROTONIX) 80 mg in sodium chloride 0.9 % 100 mL IVPB  80 mg Intravenous Once Gareth Morgan, MD      . pantoprazole (PROTONIX) 80 mg in sodium chloride 0.9 % 250 mL (0.32 mg/mL) infusion  8 mg/hr Intravenous Continuous Gareth Morgan, MD       Current Outpatient Medications   Medication Sig Dispense Refill  . Cholecalciferol (VITAMIN D3 PO) Take 1 capsule by mouth daily.    Marland Kitchen ibuprofen (ADVIL,MOTRIN) 200 MG tablet Take 200 mg by mouth every 6 (six) hours as needed.    . ondansetron (ZOFRAN) 4 MG tablet Take 1 tablet (4 mg total) by mouth every 6 (six) hours as needed for nausea. (Patient not taking: Reported on 11/14/2017) 20 tablet 0  . Polyethyl Glycol-Propyl Glycol (SYSTANE OP) Place 1 drop into both eyes 3 (three) times daily as needed (for dry eyes).    . prochlorperazine (COMPAZINE) 10 MG tablet Take 1 tablet (10 mg total) by mouth every 6 (six) hours as needed (Nausea or vomiting). 30 tablet 1  . traMADol (ULTRAM) 50 MG tablet Take 1  tablet (50 mg total) by mouth every 6 (six) hours as needed. 20 tablet 0   Facility-Administered Medications Ordered in Other Encounters  Medication Dose Route Frequency Provider Last Rate Last Dose  . 0.9 %  sodium chloride infusion   Intravenous Continuous Docia Barrier, PA 20 mL/hr at 11/20/17 1014      Allergies as of 11/20/2017  . (No Known Allergies)     Review of Systems:    Constitutional: No fever or chills Skin: No rash  Cardiovascular: No chest pain Respiratory: No SOB  Gastrointestinal: See HPI and otherwise negative Genitourinary: No dysuria  Neurological: No headache Musculoskeletal: No new muscle or joint pain Hematologic: No bruising Psychiatric: No history of depression or anxiety   Physical Exam:  Vital signs in last 24 hours: Temp:  [97.4 F (36.3 C)-98.2 F (36.8 C)] 97.4 F (36.3 C) (03/04 1206) Pulse Rate:  [96-131] 115 (03/04 1206) Resp:  [13-18] 18 (03/04 1206) BP: (104-126)/(53-76) 108/53 (03/04 1206) SpO2:  [95 %-100 %] 100 % (03/04 1206)   General:   Pleasant Jaundiced Elderly Caucasian female appears to be in NAD, Well developed, Well nourished, alert and cooperative Head:  Normocephalic and atraumatic. Eyes:   PEERL, EOMI. Icteric Conjunctiva pink. Ears:  Normal  auditory acuity. Neck:  Supple Throat: Oral cavity and pharynx without inflammation, swelling or lesion.  Lungs: Respirations even and unlabored. Lungs clear to auscultation bilaterally.   No wheezes, crackles, or rhonchi.  Heart: Normal S1, S2. No MRG. Regular rate and rhythm. No peripheral edema, cyanosis or pallor.  Abdomen:  Soft, nondistended, mild ttp around area of biliary drain insertion, No rebound or guarding. Normal bowel sounds. No appreciable masses or hepatomegaly. Biliary drain with 100cc of yellow/brown material Rectal:  Not performed.  Msk:  Symmetrical without gross deformities. Peripheral pulses intact.  Extremities:  Without edema, no deformity or joint abnormality.  Neurologic:  Alert and  oriented x4;  grossly normal neurologically.  Skin:   Dry and intact without significant lesions or rashes. Psychiatric:  Demonstrates good judgement and reason without abnormal affect or behaviors.   LAB RESULTS: Recent Labs    11/20/17 1005  WBC 12.3*  HGB 9.0*  HCT 26.6*  PLT 313   BMET Recent Labs    11/20/17 1005  NA 133*  K 3.7  CL 99*  CO2 22  GLUCOSE 110*  BUN 37*  CREATININE 0.68  CALCIUM 9.4   LFT Recent Labs    11/20/17 1005  PROT 7.5  ALBUMIN 3.4*  AST 135*  ALT 133*  ALKPHOS 173*  BILITOT 16.7*   PT/INR Recent Labs    11/20/17 1005  LABPROT 15.6*  INR 1.25   EXAM: CT ABDOMEN AND PELVIS WITH CONTRAST 11/03/17  TECHNIQUE: Multidetector CT imaging of the abdomen and pelvis was performed using the standard protocol following bolus administration of intravenous contrast.  CONTRAST:  100 cc of Isovue-300  COMPARISON:  None.  FINDINGS: Lower chest: No pleural effusion. Numerous pulmonary nodules are identified throughout both lungs. Index nodule in the right lower lobe measures 1 cm, image 4 of series 3. Index nodule in the left lower lobe measures 7 mm, image 9 of series 3.  Hepatobiliary: Moderate to marked intrahepatic bile  duct dilatation. No focal liver abnormality identified to suggest metastatic disease. The proximal common bile duct measures 1.4 cm, image 25 of series 2. There is an abrupt cut off at the level of the mid common bile duct by pancreas mass.  Pancreas:  There is a large mass centered around the expected location of the head of pancreas which measures 3.8 by 2.6 by 4.1 cm. There is surrounding soft tissue infiltration which encases and nearly completely occludes the portal vein and portal venous confluence. Tumor extends into the aortocaval region and encases both the celiac and superior mesenteric arteries. Atrophy of the body and tail of pancreas noted.  Spleen: The spleen is measures 6.9 x 7.8 x 13.3 cm (volume = 370 cm^3)  Adrenals/Urinary Tract: Normal appearance of the adrenal glands. No kidney mass or hydronephrosis. Small left lower pole cyst measures 8 mm. Urinary bladder appears normal.  Stomach/Bowel: The stomach appears normal. Tumor has mass effect upon the medial wall of the descending duodenum. Cannot rule out invasion. No gastric outlet obstruction. No abnormal small bowel dilatation identified. The appendix is visualized and is normal. Normal appearance of the colon.  Vascular/Lymphatic: Mild aortic atherosclerosis. Tumor encasement of the celiac and superior mesenteric arteries noted. There is also encasement and significant narrowing of the portal venous confluence and extrahepatic portal vein. Dilated intrahepatic portal vein is non thrombosed. Splenic vein remains patent. Small distal esophageal varices and multiple upper abdominal collateral vessels identified. Soft tissue infiltration within the proximal aortocaval region measures 1.4 by 2.9 cm. No pelvic or inguinal adenopathy.  Reproductive: The uterus appears atrophic. No adnexal mass identified.  Other: No ascites.  No peritoneal nodularity identified.  Musculoskeletal: Spondylosis identified  within the lumbar spine. There is an anterolisthesis of L4 on L5. No suspicious bone lesions.  IMPRESSION: 1. Large, aggressive appearing mass involving the head of pancreas is identified with infiltration into the surrounding soft tissues. There is evidence of vascular involvement the portal venous confluence, extrahepatic portal vein, celiac trunk and superior mesenteric artery. Mass completely occludes the common bile duct resulting in marked intrahepatic biliary dilatation. There is also mass effect upon the medial wall of the descending duodenum. Cannot rule out invasion of the duodenum. 2. Numerous bilateral lower lobe pulmonary nodules compatible with metastatic disease.   Electronically Signed   By: Kerby Moors M.D.   On: 11/03/2017 10:46    Impression / Plan:   Impression: 1.  Hematemesis: Small distal esophageal varices on CT abdomen pelvis 11/03/17, hematemesis large volume started this morning x2, last hemoglobin at 9 prior to episode, now tachycardic and hypotensive, receiving Octreotide and prbc's; Consider variceal bleed vs tumor invasion of duodenum vs other 2.  History of newly diagnosed metastatic pancreatic cancer: Diagnosed in February, external biliary drain placement 11/10/17 after unsuccessful ERCP attempt 3. Nausea  4. S/p Ext biliary drain  Plan: 1.  Agree with octreotide and blood products 2.  Continue to monitor hemoglobin with transfusion as needed less than 7 3.  EGD scheduled for tomorrow with Dr. Silverio Decamp. Did discuss risk, benefits, limitations and alternatives of the patient and she agrees to proceed. 4.  If further episode of large volume hematemesis, recommend IR consultation and evaluation for possible embolization. 5. Please await final recommendations from Dr. Silverio Decamp later today,  Thank you for your kind consultation, we will continue to follow.  Lavone Nian Otisha Spickler  11/20/2017, 12:21 PM Pager #: [phone]   Attending physician's  note   I have taken a history, examined the patient and reviewed the chart. I agree with the Advanced Practitioner's note, impression and recommendations. 72 year old female with pancreatic head cancer, nonresectable involving celiac trunk and SMA.  ERCP unsuccessful in cannulating biliary duct underwent external biliary drain placement by IR on February  22.  She was waiting to undergo internal biliary stent placement and started having episodes of large volume vomiting with blood.  She has been having constant nausea since discharge with poor p.o. Intake.  She has been eating mostly yogurt or pudding in the past few days. Suspicion for possible gastric outlet obstruction with tumor invasion in the duodenum or variceal hemorrhage Hemodynamically stable Hemoglobin 9, avoid over transfusion.  Do not transfuse packed RBC unless hemoglobin drops below 7.  She already received 1 unit in the ER Okay to continue octreotide and PPI gtt We will plan for EGD in the morning to evaluate The risks and benefits as well as alternatives of endoscopic procedure(s) have been discussed and reviewed. All questions answered. The patient agrees to proceed.   Damaris Hippo, MD [phone] Mon-Fri 8a-5p [phone] after 5p, weekends, holidays

## 2017-11-20 NOTE — Progress Notes (Signed)
  Oncology Nurse Navigator Documentation  Navigator Location: [facility] (11/20/17 1252)   )Navigator Encounter Type: Other(Interventional Radiology) (11/20/17 1252)  Pt tused Medicare transportation to her IR procedure this AM. I  was sitting with patient prior to her procedure and she began vomiting bright red blood. Patient was transferred to ED. Per patient's request I relayed pertinent information to her step-daughter on patient's cell phone.  I remained with patient until a friend from church arrived to sit with her. I will meet with patient once transferred to in-patient unit.                           Interventions: Psycho-social support (11/20/17 1252) Emotional support offered.            Acuity: Level 2 (11/20/17 1252)   Acuity Level 2: Ongoing guidance and education throughout treatment as needed (11/20/17 1252)     Time Spent with Patient: 75 (11/20/17 1252)

## 2017-11-20 NOTE — Patient Outreach (Signed)
[facility]) Care Management  11/20/2017  Elizabeth Kidd 09-27-1945 [id number]  Elizabeth Kidd a 72 y.o.femalewith medical history which includes recent diagnosis of metastatic pancreatic cancer and associated obstructive jaundice and protein calorie malnutrition. She was admitted to the hospital from 11/09/17-11/12/17 for ERCP/stent placement for treatment of the obstructive jaundice which was unsuccessful and therefore underwent percutaneous biliary draining by interventional radiology.   Elizabeth Kidd was referred to [facility] for assistance with Transition of Care and Care Coordination services. I reached out by phone to Elizabeth Kidd today understanding that she is at the cancer center for a procedure. I left a HIPPA compliant voice message advising that I would return a call to her or she could call me and left my contact information.   Provider Follow up - Elizabeth Kidd was seen in the office in follow up by Elizabeth Kidd (oncology) on 11/14/17 and he reviewed Elizabeth Kidd's image findings, prognosis, and treatment options. She was scheduled with interventional radiology for today 11/20/17, for internalization and stend placement and as of this writing has arrived for her procedure.   Advanced Directives - Per Dr. Ernestina Penna notation, he has recommended DNR/DNI and Elizabeth Kidd indicated she would think about this. He explained that the goal of her current care is palliative. She has been scheduled for CT chest (11/22/17 @ 1pm (arrive at 12:45 @ [facility] IR) and labs for this week.   Protein Calorie Malnutrition - per Dr. Ernestina Penna notation, Elizabeth Kidd has lost about 10 pounds in the last few months. Since hospital discharge, Elizabeth Kidd has experienced mild nausea which has improved overall. She has been drinking fluids and although she has low appetite, Elizabeth Kidd has been able to eat soup and yogurt.   Elizabeth Kidd has been scheduled to see the dietician Elizabeth Kidd on Tuesday, 12/22/17 @  11:15am  Home Health and Self Care - Elizabeth Kidd lives alone, walks her dog, doesn't have children, has a Psychiatrist in [facility], and reports that she has many friends from her church in the neighborhood. She has home health nursing visits through [facility]. Elizabeth Kidd has been emptying her biliary draining bag 3-4 times a day.   Transportation to Appointments - Elizabeth Kidd was given information about [employer] by the [place] LCSW.   Plan: I will follow up with Elizabeth Kidd by phone again at least once in the next 24 hours.    [facility]  [phone]

## 2017-11-20 NOTE — Progress Notes (Signed)
RN spoke with Jerald Kief who confirmed  she will be with patient for 24 hours after procedure.  RN will call Diane after procedure to discuss discharge instructions.  She verbalized understanding.

## 2017-11-20 NOTE — Progress Notes (Signed)
Patient transferred to ED room 14 by staff with no additional issues noted.  Vital signs stable however patient tachy at 131.  Report given to [place], Therapist, sports.  Samara Deist notified of patients transfer to ED per patient request.

## 2017-11-20 NOTE — ED Provider Notes (Signed)
[facility] DEPT Provider Note   CSN: [id number] Arrival date & time: 11/20/17  1115     History   Chief Complaint Chief Complaint  Patient presents with  . Hematemesis    HPI Elizabeth Kidd is a 72 y.o. female.  HPI   72 year old female with a history of metastatic pancreatic cancer with obstructive jaundice and biliary drain in place, presents from IR with concern for hematemesis developing prior to her IR procedure for biliary drain replacement.   Per nurse manager/coorinator at bedside, patient had at least 400cc of gross hematemesis with clots.  Patient reports she had one episode of emesis days ago with streaks of blood, has had continuing nausea and lightheadedness. No chest pain or dyspnea.  No cough or fevers.  Reports she had clay colored stool with some dark black component for the last few days.   Past Medical History:  Diagnosis Date  . Complication of anesthesia   . IBS (irritable bowel syndrome)   . Osteoarthritis   . Osteopenia   . PONV (postoperative nausea and vomiting)   . Raynaud's disease     Patient Active Problem List   Diagnosis Date Noted  . GIB (gastrointestinal bleeding) 11/20/2017  . Pancreatic cancer metastasized to lung ([place]) 11/14/2017  . Protein-calorie malnutrition, severe 11/10/2017  . Obstructive jaundice due to malignant neoplasm ([place]) 11/09/2017  . Pancreatic mass   . Raynaud's disease     Past Surgical History:  Procedure Laterality Date  . BREAST LUMPECTOMY     fibroid tumor benign 1987  . DILATION AND CURETTAGE OF UTERUS    . ENDOSCOPIC RETROGRADE CHOLANGIOPANCREATOGRAPHY (ERCP) WITH PROPOFOL N/A 11/09/2017   Procedure: ENDOSCOPIC RETROGRADE CHOLANGIOPANCREATOGRAPHY (ERCP) WITH PROPOFOL;  Surgeon: Milus Banister, MD;  Location: [facility];  Service: Endoscopy;  Laterality: N/A;  . EUS N/A 11/09/2017   Procedure: UPPER ENDOSCOPIC ULTRASOUND (EUS) LINEAR;  Surgeon: Milus Banister, MD;  Location:  [facility];  Service: Endoscopy;  Laterality: N/A;  . IR BILIARY DRAIN PLACEMENT WITH CHOLANGIOGRAM  11/10/2017    OB History    No data available       Home Medications    Prior to Admission medications   Medication Sig Start Date End Date Taking? Authorizing Provider  ibuprofen (ADVIL,MOTRIN) 200 MG tablet Take 200 mg by mouth every 6 (six) hours as needed.   Yes [provider]  Polyethyl Glycol-Propyl Glycol (SYSTANE OP) Place 1 drop into both eyes 3 (three) times daily as needed (for dry eyes).   Yes [provider]  prochlorperazine (COMPAZINE) 10 MG tablet Take 1 tablet (10 mg total) by mouth every 6 (six) hours as needed (Nausea or vomiting). 11/14/17  Yes Truitt Merle, MD  traMADol (ULTRAM) 50 MG tablet Take 1 tablet (50 mg total) by mouth every 6 (six) hours as needed. Patient taking differently: Take 50 mg by mouth every 6 (six) hours as needed for moderate pain.  11/14/17  Yes Truitt Merle, MD  ondansetron (ZOFRAN) 4 MG tablet Take 1 tablet (4 mg total) by mouth every 6 (six) hours as needed for nausea. Patient not taking: Reported on 11/14/2017 11/12/17   Florencia Reasons, MD    Family History Family History  Problem Relation Age of Onset  . Breast cancer Mother 82  . Lupus Sister   . Colon cancer Neg Hx   . Pancreatic cancer Neg Hx   . Stomach cancer Neg Hx   . Rectal cancer Neg Hx  Social History Social History   Tobacco Use  . Smoking status: Never Smoker  . Smokeless tobacco: Never Used  Substance Use Topics  . Alcohol use: No  . Drug use: No     Allergies   Anesthesia s-i-40 [propofol]   Review of Systems Review of Systems  Constitutional: Positive for fatigue. Negative for fever.  HENT: Negative for sore throat.   Eyes: Negative for visual disturbance.  Respiratory: Negative for cough and shortness of breath.   Cardiovascular: Negative for chest pain.  Gastrointestinal: Positive for nausea and vomiting. Negative for abdominal pain,  constipation and diarrhea.  Genitourinary: Negative for difficulty urinating.  Musculoskeletal: Negative for back pain and neck pain.  Skin: Negative for rash.  Neurological: Positive for light-headedness. Negative for syncope.     Physical Exam Updated Vital Signs BP (!) 103/57   Pulse 93   Temp 97.8 F (36.6 C) (Oral)   Resp 12   SpO2 96%   Physical Exam  Constitutional: She is oriented to person, place, and time. She appears well-developed and well-nourished. No distress.  jaundice  HENT:  Head: Normocephalic and atraumatic.  Eyes: Conjunctivae and EOM are normal.  Neck: Normal range of motion.  Cardiovascular: Regular rhythm, normal heart sounds and intact distal pulses. Tachycardia present. Exam reveals no gallop and no friction rub.  No murmur heard. Pulmonary/Chest: Effort normal and breath sounds normal. No respiratory distress. She has no wheezes. She has no rales.  Abdominal: Soft. She exhibits no distension. There is no tenderness. There is no guarding.  Musculoskeletal: She exhibits no edema or tenderness.  Neurological: She is alert and oriented to person, place, and time.  Skin: Skin is warm and dry. No rash noted. She is not diaphoretic. No erythema. There is pallor.  Nursing note and vitals reviewed.    ED Treatments / Results  Labs (all labs ordered are listed, but only abnormal results are displayed) Labs Reviewed  BASIC METABOLIC PANEL  CBC  PREPARE RBC (CROSSMATCH)  TYPE AND SCREEN  ABO/RH    EKG  EKG Interpretation  Date/Time:  Monday November 20 2017 11:17:07 EST Ventricular Rate:  129 PR Interval:    QRS Duration: 65 QT Interval:  323 QTC Calculation: 474 R Axis:   32 Text Interpretation:  Sinus tachycardia No previous ECGs available Confirmed by Gareth Morgan [id number]) on 11/20/2017 11:41:57 AM       Radiology No results found.  Procedures .Critical Care Performed by: Gareth Morgan, MD Authorized by: Gareth Morgan, MD    Critical care provider statement:    Critical care time (minutes):  30   Critical care was necessary to treat or prevent imminent or life-threatening deterioration of the following conditions:  Circulatory failure   Critical care was time spent personally by me on the following activities:  Blood draw for specimens, discussions with consultants, examination of patient, evaluation of patient's response to treatment, ordering and review of laboratory studies and ordering and performing treatments and interventions   (including critical care time)  Medications Ordered in ED Medications  octreotide (SANDOSTATIN) 2 mcg/mL load via infusion 100 mcg (100 mcg Intravenous Bolus from Bag 11/20/17 1250)    And  octreotide (SANDOSTATIN) 500 mcg in sodium chloride 0.9 % 250 mL (2 mcg/mL) infusion (50 mcg/hr Intravenous New Bag/Given 11/20/17 1249)  pantoprazole (PROTONIX) 80 mg in sodium chloride 0.9 % 250 mL (0.32 mg/mL) infusion (8 mg/hr Intravenous New Bag/Given 11/20/17 1254)  sodium chloride flush (NS) 0.9 % injection 3 mL (3  mLs Intravenous Given 11/20/17 1530)  sodium chloride flush (NS) 0.9 % injection 3 mL (not administered)  0.9 %  sodium chloride infusion (not administered)  ondansetron (ZOFRAN) tablet 4 mg (not administered)    Or  ondansetron (ZOFRAN) injection 4 mg (not administered)  sodium chloride 0.9 % bolus 500 mL (0 mLs Intravenous Stopped 11/20/17 1215)  0.9 %  sodium chloride infusion ( Intravenous Stopped 11/20/17 1619)  sodium chloride 0.9 % bolus 500 mL (0 mLs Intravenous Stopped 11/20/17 1240)  pantoprazole (PROTONIX) 80 mg in sodium chloride 0.9 % 100 mL IVPB (0 mg Intravenous Stopped 11/20/17 1244)  ondansetron (ZOFRAN) injection 4 mg (4 mg Intravenous Given 11/20/17 1201)  ondansetron (ZOFRAN) injection 4 mg (4 mg Intravenous Given 11/20/17 1311)     Initial Impression / Assessment and Plan / ED Course  I have reviewed the triage vital signs and the nursing notes.  Pertinent labs &  imaging results that were available during my care of the patient were reviewed by me and considered in my medical decision making (see chart for details).     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 Procedure was cancelled and she was sent to the ED with having at least 400cc hematemesis in IR. Initial blood pressures 299M systolic with patient tachycardic to upper 120s, with blood pressures dropping to 42A systolic shortly after arrival.  Given tachycardia, decreasing blood pressures to 90s despite NS infusion, history of large volume hematemesis with concern for possible varices as etiology, ordered pRBC transfusion. (hgb at 10AM 9 from previous of 10) Labs obtained in IR.  Hemodynamics improving with pRBC infusion. Pt given protonix and ocreatide infusions. Pt with additional 100cc of hematemesis in the ED.  Consulted GI for evaluation who plans on endoscopy tomorrow.    Pt reports to me she is DNR/DNI however otherwise would like potential procedures, blood transfusion to assist with bleeding.   Pt with blood pressures improving to 834H-962I systolic.  HR decreasing. Will admit to stepdown for further care.   Final Clinical Impressions(s) / ED Diagnoses   Final diagnoses:  Hematemesis with nausea    ED Discharge Orders    None       Gareth Morgan, MD 11/20/17 [facility] [place]

## 2017-11-20 NOTE — ED Notes (Signed)
ED TO INPATIENT HANDOFF REPORT  Name/Age/Gender Elizabeth Kidd 72 y.o. female  Code Status    Code Status Orders  (From admission, onward)        Start     Ordered   11/20/17 1521  Full code  Continuous     11/20/17 1521    Code Status History    Date Active Date Inactive Code Status Order ID Comments User Context   11/09/2017 16:14 11/12/2017 15:15 Full Code [id number]  Phillips Grout, MD Inpatient    Advance Directive Documentation     Most Recent Value  Type of Advance Directive  Healthcare Power of Attorney, Living will  Pre-existing out of facility DNR order (yellow form or pink MOST form)  No data  "MOST" Form in Place?  No data      Home/SNF/Other Home  Chief Complaint Hematemesis  Level of Care/Admitting Diagnosis ED Disposition    ED Disposition Condition [facility] Area: [place] [[id number]]  Level of Care: Stepdown [14]  Admit to SDU based on following criteria: Hemodynamic compromise or significant risk of instability:  Patient requiring short term acute titration and management of vasoactive drips, and invasive monitoring (i.e., CVP and Arterial line).  Diagnosis: GIB (gastrointestinal bleeding) [160737]  Admitting Physician: Phillips Grout [[id number]]  Attending Physician: Derrill Kay A [[id number]]  Estimated length of stay: 3 - 4 days  Certification:: I certify this patient will need inpatient services for at least 2 midnights  PT Class (Do Not Modify): Inpatient [101]  PT Acc Code (Do Not Modify): Private [1]       Medical History Past Medical History:  Diagnosis Date  . Complication of anesthesia   . IBS (irritable bowel syndrome)   . Osteoarthritis   . Osteopenia   . PONV (postoperative nausea and vomiting)   . Raynaud's disease     Allergies Allergies  Allergen Reactions  . Anesthesia S-I-40 [Propofol] Nausea Only    IV Location/Drains/Wounds Patient Lines/Drains/Airways Status   Active  Line/Drains/Airways    Name:   Placement date:   Placement time:   Site:   Days:   Peripheral IV 11/20/17 Right Forearm   11/20/17    1010    Forearm   less than 1   Peripheral IV 11/20/17 Left Antecubital   11/20/17    1100    Antecubital   less than 1   Peripheral IV 11/20/17 Right Antecubital   11/20/17    1245    Antecubital   less than 1   Biliary Tube  10 Fr. RUQ   11/10/17    1514    RUQ   10          Labs/Imaging Results for orders placed or performed during the hospital encounter of 11/20/17 (from the past 48 hour(s))  Type and screen Ordered by PROVIDER DEFAULT     Status: None (Preliminary result)   Collection Time: 11/20/17 10:58 AM  Result Value Ref Range   ABO/RH(D) A NEG    Antibody Screen NEG    Sample Expiration 11/23/2017    Unit Number [id number]    Blood Component Type RED CELLS,LR    Unit division 00    Status of Unit REL FROM [facility]    Unit tag comment VERBAL ORDERS PER DR [facility]    Transfusion Status OK TO TRANSFUSE    Crossmatch Result COMPATIBLE    Unit Number [id number]    Blood Component Type RED CELLS,LR  Unit division 00    Status of Unit ISSUED    Transfusion Status OK TO TRANSFUSE    Crossmatch Result Compatible   ABO/Rh     Status: None   Collection Time: 11/20/17 10:58 AM  Result Value Ref Range   ABO/RH(D)      A NEG Performed at [facility], [place] [address], [place], [place] [address]   Prepare RBC     Status: None   Collection Time: 11/20/17 12:00 PM  Result Value Ref Range   Order Confirmation      ORDER PROCESSED BY BLOOD BANK Performed at [facility] [address], [place], [place] [address]    No results found.  Pending Labs Unresulted Labs (From admission, onward)   Start     Ordered   11/21/17 3790  Basic metabolic panel  Tomorrow morning,   R     11/20/17 1521   11/21/17 0500  CBC  Tomorrow morning,   R     11/20/17 1521      Vitals/Pain Today's Vitals   11/20/17  1821 11/20/17 1851 11/20/17 1945 11/20/17 2200  BP: 107/68 (!) 103/57 (!) 111/49 108/61  Pulse: 97 93 98 91  Resp: 12 12 15 13   Temp:      TempSrc:      SpO2: 95% 96% 92% 95%  PainSc:        Isolation Precautions No active isolations  Medications Medications  octreotide (SANDOSTATIN) 2 mcg/mL load via infusion 100 mcg (100 mcg Intravenous Bolus from Bag 11/20/17 1250)    And  octreotide (SANDOSTATIN) 500 mcg in sodium chloride 0.9 % 250 mL (2 mcg/mL) infusion (50 mcg/hr Intravenous New Bag/Given 11/20/17 1249)  pantoprazole (PROTONIX) 80 mg in sodium chloride 0.9 % 250 mL (0.32 mg/mL) infusion (8 mg/hr Intravenous New Bag/Given 11/20/17 1254)  sodium chloride flush (NS) 0.9 % injection 3 mL (3 mLs Intravenous Given 11/20/17 1530)  sodium chloride flush (NS) 0.9 % injection 3 mL (not administered)  0.9 %  sodium chloride infusion (not administered)  ondansetron (ZOFRAN) tablet 4 mg (not administered)    Or  ondansetron (ZOFRAN) injection 4 mg (not administered)  sodium chloride 0.9 % bolus 500 mL (0 mLs Intravenous Stopped 11/20/17 1215)  0.9 %  sodium chloride infusion ( Intravenous Stopped 11/20/17 1619)  sodium chloride 0.9 % bolus 500 mL (0 mLs Intravenous Stopped 11/20/17 1240)  pantoprazole (PROTONIX) 80 mg in sodium chloride 0.9 % 100 mL IVPB (0 mg Intravenous Stopped 11/20/17 1244)  ondansetron (ZOFRAN) injection 4 mg (4 mg Intravenous Given 11/20/17 1201)  ondansetron (ZOFRAN) injection 4 mg (4 mg Intravenous Given 11/20/17 1311)    Mobility walks

## 2017-11-20 NOTE — ED Triage Notes (Addendum)
Pt coming from short stay after vomiting blood. RN stated blood clots were the size of "golf balls". Pt has a hx of pancreatic cancer. Pt has a biliary drain in place and was going for a procedure to have the drain replaced and possible stent placement. RN reports that pt has had dark stools for approx. 1 week and pt does not take any anticoagulants.

## 2017-11-20 NOTE — H&P (Signed)
History and Physical    Elizabeth Kidd WJX:[id number] DOB: 03/10/1946 DOA: 11/20/2017  PCP: Geoffry Paradise, MD  Patient coming from: Short stay  Chief Complaint: Vomiting blood  HPI: Elizabeth Kidd is a 72 y.o. female with medical history significant of new pancreatic mass with obstructive jaundice was getting an IR procedure done today for biliary drain to be reattempted.  While she was getting prepped for the procedure she started developing bright red blood per vomit.  Patient reports 2-3 days of melanotic stools.  She denies any abdominal pain.  She denies any fevers.  She has had about a half a liter of vomiting bright red blood and was tachycardic and short stay was therefore sent to the emergency department for evaluation.  She is received several liters of fluid and is getting a unit of blood right now and her vitals have become more stable.  She has not had any further bleeding since arrival to the ED.  She is on Protonix and octreotide drip.  GI has been consulted who are recommending an EGD in the morning.  Patient is referred for admission for upper GI bleed.  Review of Systems: As per HPI otherwise 10 point review of systems negative.   Past Medical History:  Diagnosis Date  . Complication of anesthesia   . IBS (irritable bowel syndrome)   . Osteoarthritis   . Osteopenia   . PONV (postoperative nausea and vomiting)   . Raynaud's disease     Past Surgical History:  Procedure Laterality Date  . BREAST LUMPECTOMY     fibroid tumor benign 1987  . DILATION AND CURETTAGE OF UTERUS    . ENDOSCOPIC RETROGRADE CHOLANGIOPANCREATOGRAPHY (ERCP) WITH PROPOFOL N/A 11/09/2017   Procedure: ENDOSCOPIC RETROGRADE CHOLANGIOPANCREATOGRAPHY (ERCP) WITH PROPOFOL;  Surgeon: Rachael Fee, MD;  Location[facility];  Service: Endoscopy;  Laterality: N/A;  . EUS N/A 11/09/2017   Procedure: UPPER ENDOSCOPIC ULTRASOUND (EUS) LINEAR;  Surgeon: Rachael Fee, MD;  Location: [facility];   Service: Endoscopy;  Laterality: N/A;  . IR BILIARY DRAIN PLACEMENT WITH CHOLANGIOGRAM  11/10/2017     reports that  has never smoked. she has never used smokeless tobacco. She reports that she does not drink alcohol or use drugs.  Allergies  Allergen Reactions  . Anesthesia S-I-40 [Propofol] Nausea Only    Family History  Problem Relation Age of Onset  . Breast cancer Mother 23  . Lupus Sister   . Colon cancer Neg Hx   . Pancreatic cancer Neg Hx   . Stomach cancer Neg Hx   . Rectal cancer Neg Hx     Prior to Admission medications   Medication Sig Start Date End Date Taking? Authorizing Provider  ibuprofen (ADVIL,MOTRIN) 200 MG tablet Take 200 mg by mouth every 6 (six) hours as needed.   Yes [provider]  Polyethyl Glycol-Propyl Glycol (SYSTANE OP) Place 1 drop into both eyes 3 (three) times daily as needed (for dry eyes).   Yes [provider]  prochlorperazine (COMPAZINE) 10 MG tablet Take 1 tablet (10 mg total) by mouth every 6 (six) hours as needed (Nausea or vomiting). 11/14/17  Yes Malachy Mood, MD  traMADol (ULTRAM) 50 MG tablet Take 1 tablet (50 mg total) by mouth every 6 (six) hours as needed. Patient taking differently: Take 50 mg by mouth every 6 (six) hours as needed for moderate pain.  11/14/17  Yes Malachy Mood, MD  ondansetron (ZOFRAN) 4 MG tablet Take 1 tablet (4 mg total) by  mouth every 6 (six) hours as needed for nausea. Patient not taking: Reported on 11/14/2017 11/12/17   Albertine Grates, MD    Physical Exam: Vitals:   11/20/17 1255 11/20/17 1315 11/20/17 1424 11/20/17 1445  BP: (!) 115/55 (!) 102/53 (!) 103/59 (!) 118/56  Pulse: (!) 102 (!) 113 (!) 111 (!) 102  Resp: 19 19 20 17   Temp:      TempSrc:      SpO2: 98% 99% 95% 96%      Constitutional: NAD, calm, comfortable jaundiced Vitals:   11/20/17 1255 11/20/17 1315 11/20/17 1424 11/20/17 1445  BP: (!) 115/55 (!) 102/53 (!) 103/59 (!) 118/56  Pulse: (!) 102 (!) 113 (!) 111 (!) 102  Resp: 19 19  20 17   Temp:      TempSrc:      SpO2: 98% 99% 95% 96%   Eyes: PERRL, lids and conjunctivae normal ENMT: Mucous membranes are moist. Posterior pharynx clear of any exudate or lesions.Normal dentition.  Neck: normal, supple, no masses, no thyromegaly Respiratory: clear to auscultation bilaterally, no wheezing, no crackles. Normal respiratory effort. No accessory muscle use.  Cardiovascular: Regular rate and rhythm, no murmurs / rubs / gallops. No extremity edema. 2+ pedal pulses. No carotid bruits.  Abdomen: no tenderness, no masses palpated. No hepatosplenomegaly. Bowel sounds positive.  Musculoskeletal: no clubbing / cyanosis. No joint deformity upper and lower extremities. Good ROM, no contractures. Normal muscle tone.  Skin: no rashes, lesions, ulcers. No induration jaundiced  neurologic: CN 2-12 grossly intact. Sensation intact, DTR normal. Strength 5/5 in all 4.  Psychiatric: Normal judgment and insight. Alert and oriented x 3. Normal mood.    Labs on Admission: I have personally reviewed following labs and imaging studies  CBC: Recent Labs  Lab 11/20/17 1005  WBC 12.3*  HGB 9.0*  HCT 26.6*  MCV 104.7*  PLT 313   Basic Metabolic Panel: Recent Labs  Lab 11/20/17 1005  NA 133*  K 3.7  CL 99*  CO2 22  GLUCOSE 110*  BUN 37*  CREATININE 0.68  CALCIUM 9.4   GFR: Estimated Creatinine Clearance: 43.4 mL/min (by C-G formula based on SCr of 0.68 mg/dL). Liver Function Tests: Recent Labs  Lab 11/20/17 1005  AST 135*  ALT 133*  ALKPHOS 173*  BILITOT 16.7*  PROT 7.5  ALBUMIN 3.4*   No results for input(s): LIPASE, AMYLASE in the last 168 hours. No results for input(s): AMMONIA in the last 168 hours. Coagulation Profile: Recent Labs  Lab 11/20/17 1005  INR 1.25   Cardiac Enzymes: No results for input(s): CKTOTAL, CKMB, CKMBINDEX, TROPONINI in the last 168 hours. BNP (last 3 results) No results for input(s): PROBNP in the last 8760 hours. HbA1C: No results for  input(s): HGBA1C in the last 72 hours. CBG: No results for input(s): GLUCAP in the last 168 hours. Lipid Profile: No results for input(s): CHOL, HDL, LDLCALC, TRIG, CHOLHDL, LDLDIRECT in the last 72 hours. Thyroid Function Tests: No results for input(s): TSH, T4TOTAL, FREET4, T3FREE, THYROIDAB in the last 72 hours. Anemia Panel: No results for input(s): VITAMINB12, FOLATE, FERRITIN, TIBC, IRON, RETICCTPCT in the last 72 hours. Urine analysis:    Component Value Date/Time   COLORURINE YELLOW 03/19/2011 2135   APPEARANCEUR CLEAR 03/19/2011 2135   LABSPEC 1.006 03/19/2011 2135   PHURINE 7.0 03/19/2011 2135   GLUCOSEU NEGATIVE 03/19/2011 2135   HGBUR NEGATIVE 03/19/2011 2135   BILIRUBINUR NEGATIVE 03/19/2011 2135   KETONESUR NEGATIVE 03/19/2011 2135   PROTEINUR NEGATIVE 03/19/2011  2135   UROBILINOGEN 0.2 03/19/2011 2135   NITRITE NEGATIVE 03/19/2011 2135   LEUKOCYTESUR NEGATIVE 03/19/2011 2135   Sepsis Labs: !!!!!!!!!!!!!!!!!!!!!!!!!!!!!!!!!!!!!!!!!!!! @LABRCNTIP (procalcitonin:4,lacticidven:4) ) Recent Results (from the past 240 hour(s))  Aerobic/Anaerobic Culture (surgical/deep wound)     Status: None   Collection Time: 11/10/17  3:32 PM  Result Value Ref Range Status   Specimen Description   Final    BILE Performed at [facility], [address], [place], [place] [address]    Special Requests   Final    Normal Performed at [facility], [address] [place], [place] [address]    Gram Stain NO WBC SEEN NO ORGANISMS SEEN   Final   Culture   Final    No growth aerobically or anaerobically. Performed at [facility], [address] [place], [place] [address]    Report Status 11/15/2017 FINAL  Final     Radiological Exams on Admission: No results found.  Old chart reviewed Case discussed with EDP Twelve-lead EKG reviewed sinus tachycardia without any acute changes  Assessment/Plan 72 year old female with newly  diagnosed pancreatic mass with biliary obstruction comes in with acute upper GI bleed Principal Problem:   GIB (gastrointestinal bleeding)-continue Protonix and octreotide drip.  GI consulted planning on doing a EGD in the morning.  N.p.o. except ice chips.  Transfusing 1 unit of blood right now.  Will repeat an H&H in hour after that unit of blood done.  Monitor closely for any further bleeding.  She has not had any further bleeding since octreotide and Protonix have been started.  Active Problems:   Obstructive jaundice due to malignant neoplasm (HCC)-will need to be arranged with IR again once medically stable    Raynaud's disease-stable   pancreatic cancer metastasized to lung (HCC)-patient is scheduled for her first chemo treatment on Friday.  This may need to be rescheduled pending on how long she will be here.    DVT prophylaxis: SCDs Code Status: Full Family Communication: None Disposition Plan: Per day team Consults called: GI Admission status: Admission   Jahmere Bramel A MD [facility]  If 7PM-7AM, please contact night-coverage [email or web address] Password [facility]  11/20/2017, 3:19 PM

## 2017-11-20 NOTE — Consult Note (Addendum)
   Consultation  Referring Provider:  Dr. Schlossman    Primary Care Physician:  Aronson, Richard, MD Primary Gastroenterologist:  Dr. Perry       Reason for Consultation: Hematemesis            HPI:   Elizabeth Kidd is a 72 y.o. female with a history of pancreatic cancer, who presented to the ER today from short stay with acute hematemesis.    Patient seen today by IR and short stay for internalization of her biliary drain to be re-attempted.  External biliary drain was placed 11/10/17.  Arrived denying complaints however began to complain of nausea and started vomiting a large quantity of bright red blood.  Upon furthering questioning admitted to bloody vomit several days ago which never recurred and some dark bowel movements.  Had been feeling lightheaded and dizzy.     Today, explains that on Friday, 3 days ago she did vomit one time and did see a small amount of bright red blood, but after that stopped vomiting and just continued with nausea.  She then did have one dark stool on Saturday 2 days ago, but has not had a stool since.  Continued with a large amount of nausea unhelped by antiemetics given by oncologist.  Admits to feeling a little dizzy.  This morning upon arriving to IR for hopeful internalization of biliary drain had further nausea and 2 big episodes of "a large amount of bright red blood" in her vomit.  She was then taken to the ER.  Last food intake was a half a cup of yogurt yesterday.  No fluids or food today.    Denies fever or chills.    ER course: Tachycardic in the 120s, BP 90-106/53-68, hemoglobin 9.0 this morning  GI history: 2/22-biliary drain placement by IR 11/09/17-EUS: Irregularly shaped, approximately 3.7 cm mass in the pancreatic head, preliminary cytology positive for adenocarcinoma, EUS and CT images show this mass encases, and invades the portal vein involves the celiac trunk, SMA (nonresectable), pulmonary nodule on abdominal CT suggest metastatic disease           -ERCP: Tight biliary stricture at the level of the common hepatic duct, cystic duct takeoff, unable to advance either wire or inject dye through the stricture; plan: Patient admitted for PTC biliary drain via IR 11/07/17-office visit, Dr. Perry: A week ago had abdominal discomfort and Hemoccult positive stool, found to be jaundiced on physical exam, subsequently underwent laboratories which revealed abnormal liver tests with AST 84, ALT 81, alk phos 524, total bilirubin 9.4, hemoglobin 11.5, INR 1.2.  CT of the abdomen and pelvis performed 11/03/17 with a large aggressive appearing mass involving the head of the pancreas with infiltration into surrounding soft tissues with evidence of vascular involvement and biliary obstruction, mass-effect on the descending duodenum as well.  Numerous bilateral pulmonary nodules felt to possibly represent metastatic disease.;  Plan: Set up for EUS and ERCP February 21 and referred to medical oncologist  Past Medical History:  Diagnosis Date  . Complication of anesthesia   . IBS (irritable bowel syndrome)   . Osteoarthritis   . Osteopenia   . PONV (postoperative nausea and vomiting)   . Raynaud's disease     Past Surgical History:  Procedure Laterality Date  . BREAST LUMPECTOMY     fibroid tumor benign 1987  . DILATION AND CURETTAGE OF UTERUS    . ENDOSCOPIC RETROGRADE CHOLANGIOPANCREATOGRAPHY (ERCP) WITH PROPOFOL N/A 11/09/2017   Procedure: ENDOSCOPIC RETROGRADE CHOLANGIOPANCREATOGRAPHY (  ERCP) WITH PROPOFOL;  Surgeon: Jacobs, Daniel P, MD;  Location: [facility];  Service: Endoscopy;  Laterality: N/A;  . EUS N/A 11/09/2017   Procedure: UPPER ENDOSCOPIC ULTRASOUND (EUS) LINEAR;  Surgeon: Jacobs, Daniel P, MD;  Location: [facility];  Service: Endoscopy;  Laterality: N/A;  . IR BILIARY DRAIN PLACEMENT WITH CHOLANGIOGRAM  11/10/2017    Family History  Problem Relation Age of Onset  . Breast cancer Mother 65  . Lupus Sister   . Colon cancer Neg Hx     . Pancreatic cancer Neg Hx   . Stomach cancer Neg Hx   . Rectal cancer Neg Hx      Social History   Tobacco Use  . Smoking status: Never Smoker  . Smokeless tobacco: Never Used  Substance Use Topics  . Alcohol use: No  . Drug use: No    Prior to Admission medications   Medication Sig Start Date End Date Taking? Authorizing Provider  Cholecalciferol (VITAMIN D3 PO) Take 1 capsule by mouth daily.    [provider]  ibuprofen (ADVIL,MOTRIN) 200 MG tablet Take 200 mg by mouth every 6 (six) hours as needed.    [provider]  ondansetron (ZOFRAN) 4 MG tablet Take 1 tablet (4 mg total) by mouth every 6 (six) hours as needed for nausea. Patient not taking: Reported on 11/14/2017 11/12/17   Xu, Fang, MD  Polyethyl Glycol-Propyl Glycol (SYSTANE OP) Place 1 drop into both eyes 3 (three) times daily as needed (for dry eyes).    [provider]  prochlorperazine (COMPAZINE) 10 MG tablet Take 1 tablet (10 mg total) by mouth every 6 (six) hours as needed (Nausea or vomiting). 11/14/17   Feng, Yan, MD  traMADol (ULTRAM) 50 MG tablet Take 1 tablet (50 mg total) by mouth every 6 (six) hours as needed. 11/14/17   Feng, Yan, MD    Current Facility-Administered Medications  Medication Dose Route Frequency Provider Last Rate Last Dose  . 0.9 %  sodium chloride infusion   Intravenous Once Schlossman, Erin, MD      . octreotide (SANDOSTATIN) 2 mcg/mL load via infusion 100 mcg  100 mcg Intravenous Once Schlossman, Erin, MD       And  . octreotide (SANDOSTATIN) 500 mcg in sodium chloride 0.9 % 250 mL (2 mcg/mL) infusion  50 mcg/hr Intravenous Continuous Schlossman, Erin, MD      . pantoprazole (PROTONIX) 80 mg in sodium chloride 0.9 % 100 mL IVPB  80 mg Intravenous Once Schlossman, Erin, MD      . pantoprazole (PROTONIX) 80 mg in sodium chloride 0.9 % 250 mL (0.32 mg/mL) infusion  8 mg/hr Intravenous Continuous Schlossman, Erin, MD       Current Outpatient Medications   Medication Sig Dispense Refill  . Cholecalciferol (VITAMIN D3 PO) Take 1 capsule by mouth daily.    . ibuprofen (ADVIL,MOTRIN) 200 MG tablet Take 200 mg by mouth every 6 (six) hours as needed.    . ondansetron (ZOFRAN) 4 MG tablet Take 1 tablet (4 mg total) by mouth every 6 (six) hours as needed for nausea. (Patient not taking: Reported on 11/14/2017) 20 tablet 0  . Polyethyl Glycol-Propyl Glycol (SYSTANE OP) Place 1 drop into both eyes 3 (three) times daily as needed (for dry eyes).    . prochlorperazine (COMPAZINE) 10 MG tablet Take 1 tablet (10 mg total) by mouth every 6 (six) hours as needed (Nausea or vomiting). 30 tablet 1  . traMADol (ULTRAM) 50 MG tablet Take 1   tablet (50 mg total) by mouth every 6 (six) hours as needed. 20 tablet 0   Facility-Administered Medications Ordered in Other Encounters  Medication Dose Route Frequency Provider Last Rate Last Dose  . 0.9 %  sodium chloride infusion   Intravenous Continuous Matthews, Kacie Sue-Ellen, PA 20 mL/hr at 11/20/17 1014      Allergies as of 11/20/2017  . (No Known Allergies)     Review of Systems:    Constitutional: No fever or chills Skin: No rash  Cardiovascular: No chest pain Respiratory: No SOB  Gastrointestinal: See HPI and otherwise negative Genitourinary: No dysuria  Neurological: No headache Musculoskeletal: No new muscle or joint pain Hematologic: No bruising Psychiatric: No history of depression or anxiety   Physical Exam:  Vital signs in last 24 hours: Temp:  [97.4 F (36.3 C)-98.2 F (36.8 C)] 97.4 F (36.3 C) (03/04 1206) Pulse Rate:  [96-131] 115 (03/04 1206) Resp:  [13-18] 18 (03/04 1206) BP: (104-126)/(53-76) 108/53 (03/04 1206) SpO2:  [95 %-100 %] 100 % (03/04 1206)   General:   Pleasant Jaundiced Elderly Caucasian female appears to be in NAD, Well developed, Well nourished, alert and cooperative Head:  Normocephalic and atraumatic. Eyes:   PEERL, EOMI. Icteric Conjunctiva pink. Ears:  Normal  auditory acuity. Neck:  Supple Throat: Oral cavity and pharynx without inflammation, swelling or lesion.  Lungs: Respirations even and unlabored. Lungs clear to auscultation bilaterally.   No wheezes, crackles, or rhonchi.  Heart: Normal S1, S2. No MRG. Regular rate and rhythm. No peripheral edema, cyanosis or pallor.  Abdomen:  Soft, nondistended, mild ttp around area of biliary drain insertion, No rebound or guarding. Normal bowel sounds. No appreciable masses or hepatomegaly. Biliary drain with 100cc of yellow/brown material Rectal:  Not performed.  Msk:  Symmetrical without gross deformities. Peripheral pulses intact.  Extremities:  Without edema, no deformity or joint abnormality.  Neurologic:  Alert and  oriented x4;  grossly normal neurologically.  Skin:   Dry and intact without significant lesions or rashes. Psychiatric:  Demonstrates good judgement and reason without abnormal affect or behaviors.   LAB RESULTS: Recent Labs    11/20/17 1005  WBC 12.3*  HGB 9.0*  HCT 26.6*  PLT 313   BMET Recent Labs    11/20/17 1005  NA 133*  K 3.7  CL 99*  CO2 22  GLUCOSE 110*  BUN 37*  CREATININE 0.68  CALCIUM 9.4   LFT Recent Labs    11/20/17 1005  PROT 7.5  ALBUMIN 3.4*  AST 135*  ALT 133*  ALKPHOS 173*  BILITOT 16.7*   PT/INR Recent Labs    11/20/17 1005  LABPROT 15.6*  INR 1.25   EXAM: CT ABDOMEN AND PELVIS WITH CONTRAST 11/03/17  TECHNIQUE: Multidetector CT imaging of the abdomen and pelvis was performed using the standard protocol following bolus administration of intravenous contrast.  CONTRAST:  100 cc of Isovue-300  COMPARISON:  None.  FINDINGS: Lower chest: No pleural effusion. Numerous pulmonary nodules are identified throughout both lungs. Index nodule in the right lower lobe measures 1 cm, image 4 of series 3. Index nodule in the left lower lobe measures 7 mm, image 9 of series 3.  Hepatobiliary: Moderate to marked intrahepatic bile  duct dilatation. No focal liver abnormality identified to suggest metastatic disease. The proximal common bile duct measures 1.4 cm, image 25 of series 2. There is an abrupt cut off at the level of the mid common bile duct by pancreas mass.  Pancreas:   There is a large mass centered around the expected location of the head of pancreas which measures 3.8 by 2.6 by 4.1 cm. There is surrounding soft tissue infiltration which encases and nearly completely occludes the portal vein and portal venous confluence. Tumor extends into the aortocaval region and encases both the celiac and superior mesenteric arteries. Atrophy of the body and tail of pancreas noted.  Spleen: The spleen is measures 6.9 x 7.8 x 13.3 cm (volume = 370 cm^3)  Adrenals/Urinary Tract: Normal appearance of the adrenal glands. No kidney mass or hydronephrosis. Small left lower pole cyst measures 8 mm. Urinary bladder appears normal.  Stomach/Bowel: The stomach appears normal. Tumor has mass effect upon the medial wall of the descending duodenum. Cannot rule out invasion. No gastric outlet obstruction. No abnormal small bowel dilatation identified. The appendix is visualized and is normal. Normal appearance of the colon.  Vascular/Lymphatic: Mild aortic atherosclerosis. Tumor encasement of the celiac and superior mesenteric arteries noted. There is also encasement and significant narrowing of the portal venous confluence and extrahepatic portal vein. Dilated intrahepatic portal vein is non thrombosed. Splenic vein remains patent. Small distal esophageal varices and multiple upper abdominal collateral vessels identified. Soft tissue infiltration within the proximal aortocaval region measures 1.4 by 2.9 cm. No pelvic or inguinal adenopathy.  Reproductive: The uterus appears atrophic. No adnexal mass identified.  Other: No ascites.  No peritoneal nodularity identified.  Musculoskeletal: Spondylosis identified  within the lumbar spine. There is an anterolisthesis of L4 on L5. No suspicious bone lesions.  IMPRESSION: 1. Large, aggressive appearing mass involving the head of pancreas is identified with infiltration into the surrounding soft tissues. There is evidence of vascular involvement the portal venous confluence, extrahepatic portal vein, celiac trunk and superior mesenteric artery. Mass completely occludes the common bile duct resulting in marked intrahepatic biliary dilatation. There is also mass effect upon the medial wall of the descending duodenum. Cannot rule out invasion of the duodenum. 2. Numerous bilateral lower lobe pulmonary nodules compatible with metastatic disease.   Electronically Signed   By: Taylor  Stroud M.D.   On: 11/03/2017 10:46    Impression / Plan:   Impression: 1.  Hematemesis: Small distal esophageal varices on CT abdomen pelvis 11/03/17, hematemesis large volume started this morning x2, last hemoglobin at 9 prior to episode, now tachycardic and hypotensive, receiving Octreotide and prbc's; Consider variceal bleed vs tumor invasion of duodenum vs other 2.  History of newly diagnosed metastatic pancreatic cancer: Diagnosed in February, external biliary drain placement 11/10/17 after unsuccessful ERCP attempt 3. Nausea  4. S/p Ext biliary drain  Plan: 1.  Agree with octreotide and blood products 2.  Continue to monitor hemoglobin with transfusion as needed less than 7 3.  EGD scheduled for tomorrow with Dr. Nandigam. Did discuss risk, benefits, limitations and alternatives of the patient and she agrees to proceed. 4.  If further episode of large volume hematemesis, recommend IR consultation and evaluation for possible embolization. 5. Please await final recommendations from Dr. Nandigam later today,  Thank you for your kind consultation, we will continue to follow.  Jennifer Lynne Lemmon  11/20/2017, 12:21 PM Pager #: [phone]   Attending physician's  note   I have taken a history, examined the patient and reviewed the chart. I agree with the Advanced Practitioner's note, impression and recommendations. 71-year-old female with pancreatic head cancer, nonresectable involving celiac trunk and SMA.  ERCP unsuccessful in cannulating biliary duct underwent external biliary drain placement by IR on February   22.  She was waiting to undergo internal biliary stent placement and started having episodes of large volume vomiting with blood.  She has been having constant nausea since discharge with poor p.o. Intake.  She has been eating mostly yogurt or pudding in the past few days. Suspicion for possible gastric outlet obstruction with tumor invasion in the duodenum or variceal hemorrhage Hemodynamically stable Hemoglobin 9, avoid over transfusion.  Do not transfuse packed RBC unless hemoglobin drops below 7.  She already received 1 unit in the ER Okay to continue octreotide and PPI gtt We will plan for EGD in the morning to evaluate The risks and benefits as well as alternatives of endoscopic procedure(s) have been discussed and reviewed. All questions answered. The patient agrees to proceed.   K Veena Nandigam, MD [phone] Mon-Fri 8a-5p [phone] after 5p, weekends, holidays 

## 2017-11-20 NOTE — H&P (Signed)
Chief Complaint: Patient was seen in consultation today for biliary drain  Referring Physician(s): Dr. Henrene Pastor  Supervising Physician: Sandi Mariscal  Patient Status: [facility] - Out-pt  History of Present Illness: Elizabeth Kidd is a 72 y.o. female with past medical history of IBS, Raynaud's disease, arthritis, who was recently found to have a pancreatic mass with obstructive jaundice.  She underwent external biliary drain placement 11/10/17 and presents today for internalization re-attempt.   Patient initially arrived to short stay denying complaint.  Her initial vital signs were stable.  However, she began to complain of nausea and stated she need to vomit.  Patient vomited a large quantity of bright red blood. Upon further questioning she states she has had dark bowel movements at home and did have bloody vomit several days ago which never recurred.  She has also been feeling lightheaded and dizzy. She did not seek evaluation because she none of these symptoms seemed to get worse.  She does live at home alone.    Past Medical History:  Diagnosis Date  . Complication of anesthesia   . IBS (irritable bowel syndrome)   . Osteoarthritis   . Osteopenia   . PONV (postoperative nausea and vomiting)   . Raynaud's disease     Past Surgical History:  Procedure Laterality Date  . BREAST LUMPECTOMY     fibroid tumor benign 1987  . DILATION AND CURETTAGE OF UTERUS    . ENDOSCOPIC RETROGRADE CHOLANGIOPANCREATOGRAPHY (ERCP) WITH PROPOFOL N/A 11/09/2017   Procedure: ENDOSCOPIC RETROGRADE CHOLANGIOPANCREATOGRAPHY (ERCP) WITH PROPOFOL;  Surgeon: Milus Banister, MD;  Location: [facility]OSCOPY;  Service: Endoscopy;  Laterality: N/A;  . EUS N/A 11/09/2017   Procedure: UPPER ENDOSCOPIC ULTRASOUND (EUS) LINEAR;  Surgeon: Milus Banister, MD;  Location: [facility];  Service: Endoscopy;  Laterality: N/A;  . IR BILIARY DRAIN PLACEMENT WITH CHOLANGIOGRAM  11/10/2017    Allergies: Patient has no known  allergies.  Medications: Prior to Admission medications   Medication Sig Start Date End Date Taking? Authorizing Provider  Polyethyl Glycol-Propyl Glycol (SYSTANE OP) Place 1 drop into both eyes 3 (three) times daily as needed (for dry eyes).   Yes [provider]  traMADol (ULTRAM) 50 MG tablet Take 1 tablet (50 mg total) by mouth every 6 (six) hours as needed. 11/14/17  Yes Truitt Merle, MD  Cholecalciferol (VITAMIN D3 PO) Take 1 capsule by mouth daily.    [provider]  ibuprofen (ADVIL,MOTRIN) 200 MG tablet Take 200 mg by mouth every 6 (six) hours as needed.    [provider]  ondansetron (ZOFRAN) 4 MG tablet Take 1 tablet (4 mg total) by mouth every 6 (six) hours as needed for nausea. Patient not taking: Reported on 11/14/2017 11/12/17   Florencia Reasons, MD  prochlorperazine (COMPAZINE) 10 MG tablet Take 1 tablet (10 mg total) by mouth every 6 (six) hours as needed (Nausea or vomiting). 11/14/17   Truitt Merle, MD     Family History  Problem Relation Age of Onset  . Breast cancer Mother 33  . Lupus Sister   . Colon cancer Neg Hx   . Pancreatic cancer Neg Hx   . Stomach cancer Neg Hx   . Rectal cancer Neg Hx     Social History   Socioeconomic History  . Marital status: Widowed    Spouse name: None  . Number of children: None  . Years of education: None  . Highest education level: None  Social Needs  . Financial resource strain: None  .  Food insecurity - worry: None  . Food insecurity - inability: None  . Transportation needs - medical: None  . Transportation needs - non-medical: None  Occupational History  . None  Tobacco Use  . Smoking status: Never Smoker  . Smokeless tobacco: Never Used  Substance and Sexual Activity  . Alcohol use: No  . Drug use: No  . Sexual activity: None  Other Topics Concern  . None  Social History Narrative  . None     Review of Systems: A 12 point ROS discussed and pertinent positives are indicated in the HPI above.   All other systems are negative.  Review of Systems  Constitutional: Positive for fatigue. Negative for fever.  Respiratory: Negative for cough and shortness of breath.   Cardiovascular: Negative for chest pain.  Gastrointestinal: Positive for abdominal pain, blood in stool and vomiting.  Psychiatric/Behavioral: Negative for behavioral problems and confusion.    Vital Signs: BP 126/76   Pulse (!) 122   Temp 98.2 F (36.8 C) (Oral)   Resp 13   SpO2 97%   Physical Exam  Constitutional: She is oriented to person, place, and time. She appears well-developed.  Cardiovascular: Regular rhythm and normal heart sounds.  tachycardia  Pulmonary/Chest: Effort normal and breath sounds normal. No respiratory distress.  Abdominal: Soft. There is no tenderness.  Biliary drain in place.   Neurological: She is alert and oriented to person, place, and time.  Skin: Skin is warm and dry. There is pallor.  jaundice  Psychiatric: She has a normal mood and affect. Her behavior is normal. Judgment and thought content normal.  Nursing note and vitals reviewed.        Imaging: Ct Abdomen Pelvis W Contrast  Result Date: 11/03/2017 CLINICAL DATA:  Abdominal pain, weight loss and jaundice. EXAM: CT ABDOMEN AND PELVIS WITH CONTRAST TECHNIQUE: Multidetector CT imaging of the abdomen and pelvis was performed using the standard protocol following bolus administration of intravenous contrast. CONTRAST:  100 cc of Isovue-300 COMPARISON:  None. FINDINGS: Lower chest: No pleural effusion. Numerous pulmonary nodules are identified throughout both lungs. Index nodule in the right lower lobe measures 1 cm, image 4 of series 3. Index nodule in the left lower lobe measures 7 mm, image 9 of series 3. Hepatobiliary: Moderate to marked intrahepatic bile duct dilatation. No focal liver abnormality identified to suggest metastatic disease. The proximal common bile duct measures 1.4 cm, image 25 of series 2. There is an abrupt  cut off at the level of the mid common bile duct by pancreas mass. Pancreas: There is a large mass centered around the expected location of the head of pancreas which measures 3.8 by 2.6 by 4.1 cm. There is surrounding soft tissue infiltration which encases and nearly completely occludes the portal vein and portal venous confluence. Tumor extends into the aortocaval region and encases both the celiac and superior mesenteric arteries. Atrophy of the body and tail of pancreas noted. Spleen: The spleen is measures 6.9 x 7.8 x 13.3 cm (volume = 370 cm^3) Adrenals/Urinary Tract: Normal appearance of the adrenal glands. No kidney mass or hydronephrosis. Small left lower pole cyst measures 8 mm. Urinary bladder appears normal. Stomach/Bowel: The stomach appears normal. Tumor has mass effect upon the medial wall of the descending duodenum. Cannot rule out invasion. No gastric outlet obstruction. No abnormal small bowel dilatation identified. The appendix is visualized and is normal. Normal appearance of the colon. Vascular/Lymphatic: Mild aortic atherosclerosis. Tumor encasement of the celiac and superior  mesenteric arteries noted. There is also encasement and significant narrowing of the portal venous confluence and extrahepatic portal vein. Dilated intrahepatic portal vein is non thrombosed. Splenic vein remains patent. Small distal esophageal varices and multiple upper abdominal collateral vessels identified. Soft tissue infiltration within the proximal aortocaval region measures 1.4 by 2.9 cm. No pelvic or inguinal adenopathy. Reproductive: The uterus appears atrophic. No adnexal mass identified. Other: No ascites.  No peritoneal nodularity identified. Musculoskeletal: Spondylosis identified within the lumbar spine. There is an anterolisthesis of L4 on L5. No suspicious bone lesions. IMPRESSION: 1. Large, aggressive appearing mass involving the head of pancreas is identified with infiltration into the surrounding soft  tissues. There is evidence of vascular involvement the portal venous confluence, extrahepatic portal vein, celiac trunk and superior mesenteric artery. Mass completely occludes the common bile duct resulting in marked intrahepatic biliary dilatation. There is also mass effect upon the medial wall of the descending duodenum. Cannot rule out invasion of the duodenum. 2. Numerous bilateral lower lobe pulmonary nodules compatible with metastatic disease. Electronically Signed   By: Kerby Moors M.D.   On: 11/03/2017 10:46   Dg Ercp Biliary & Pancreatic Ducts  Result Date: 11/09/2017 CLINICAL DATA:  Pancreatic mass.  Attempted ERCP. EXAM: ERCP TECHNIQUE: Multiple spot images obtained with the fluoroscopic device and submitted for interpretation post-procedure. COMPARISON:  CT abdomen and pelvis - 11/03/2017 FLUOROSCOPY TIME:  13 minutes, 43 seconds FINDINGS: 3 spot intraoperative fluoroscopic images of the right upper abdominal quadrant during ERCP are provided for review. Initial image demonstrates an ERCP probe overlying the right upper abdominal quadrant. There is passage of a wire through the CBD and into the origin of the cystic duct with faint contrast opacification. Subsequent images demonstrate faint opacification the distal aspect of the CBD as well as the pancreatic duct. There is no significant opacification intrahepatic biliary system. No are discrete filling defects within the opacified portions of the biliary system. IMPRESSION: ERCP as above. These images were submitted for radiologic interpretation only. Please see the procedural report for the amount of contrast and the fluoroscopy time utilized. Electronically Signed   By: Sandi Mariscal M.D.   On: 11/09/2017 14:34   Ir Biliary Drain Placement With Cholangiogram  Result Date: 11/10/2017 INDICATION: Pancreas mass, biliary obstruction, jaundice EXAM: ULTRASOUND AND FLUOROSCOPIC RIGHT EXTERNAL ONLY 10 FRENCH BILIARY DRAIN MEDICATIONS: 3.375 G ZOSYN;  The antibiotic was administered within an appropriate time frame prior to the initiation of the procedure. ANESTHESIA/SEDATION: Moderate (conscious) sedation was employed during this procedure. A total of Versed 3 mg and Fentanyl 150 mcg was administered intravenously. Moderate Sedation Time: 47 minutes. The patient's level of consciousness and vital signs were monitored continuously by radiology nursing throughout the procedure under my direct supervision. FLUOROSCOPY TIME:  Fluoroscopy Time: 11 minutes 54 seconds (101 mGy). COMPLICATIONS: None immediate. PROCEDURE: Informed written consent was obtained from the patient after a thorough discussion of the procedural risks, benefits and alternatives. All questions were addressed. Maximal Sterile Barrier Technique was utilized including caps, mask, sterile gowns, sterile gloves, sterile drape, hand hygiene and skin antiseptic. A timeout was performed prior to the initiation of the procedure. Preliminary ultrasound performed. Dilated right hepatic ducts demonstrated. Lower intercostal space marked. Under sterile conditions and local anesthesia, a 21 gauge access needle was advanced percutaneously to a peripheral right hepatic duct. Contrast injection performed for cholangiogram. Diffuse biliary dilatation noted. Distal CBD obstruction evident. Eventually a 018 guidewire advanced into the biliary tree. Accustick dilator set advanced. Amplatz  guidewire exchange performed. Kumpe catheter and Glidewire were utilized to attempt manipulation past the biliary obstruction however this was unsuccessful related to the marked dilatation of the CBD. Amplatz guidewire placed to insert a Questa catheter. Retention loop formed in the obstructed dilated common bile duct. Ducts were decompressed by syringe aspiration. Catheter secured with a Prolene suture and connected to external gravity drainage bag. Sterile dressing applied. No immediate complication. Patient  tolerated the procedure well. IMPRESSION: Successful ultrasound and fluoroscopic 10 French right external biliary drain insertion. Bile culture sent. Electronically Signed   By: Jerilynn Mages.  Shick M.D.   On: 11/10/2017 15:42    Labs:  CBC: Recent Labs    11/10/17 0353 11/11/17 0431 11/12/17 0502 11/20/17 1005  WBC 6.2 5.7 5.0 12.3*  HGB 10.1* 10.5* 10.1* 9.0*  HCT 29.9* 32.3* 31.0* 26.6*  PLT 178 169 148* 313    COAGS: Recent Labs    10/31/17 1443 11/20/17 1005  INR 1.2* 1.25  APTT  --  33    BMP: Recent Labs    11/10/17 0353 11/11/17 0431 11/12/17 0502 11/20/17 1005  NA 135 135 135 133*  K 4.2 4.2 4.3 3.7  CL 103 103 101 99*  CO2 22 24 26 22   GLUCOSE 140* 110* 117* 110*  BUN 16 15 14  37*  CALCIUM 9.0 8.8* 8.6* 9.4  CREATININE 0.79 0.95 0.82 0.68  GFRNONAA >60 58* >60 >60  GFRAA >60 >60 >60 >60    LIVER FUNCTION TESTS: Recent Labs    11/10/17 0353 11/11/17 0431 11/12/17 0502 11/20/17 1005  BILITOT 13.0* 10.0* 8.9* 16.7*  AST 113* 108* 92* 135*  ALT 89* 88* 82* 133*  ALKPHOS 313* 299* 267* 173*  PROT 6.4* 6.6 6.5 7.5  ALBUMIN 2.9* 3.0* 2.9* 3.4*    TUMOR MARKERS: No results for input(s): AFPTM, CEA, CA199, CHROMGRNA in the last 8760 hours.  Assessment and Plan: Acute GI bleed Patient with plans for biliary drain internalization, however developed acute hematemesis.   Discussed with TRH who recommends transfer to ED.  Her BP is currently stable at 107/68.  HR is elevated at 122.  She continues to vomit bright red blood with clot.  Her HgB on presentation this AM was 9.0. Called ED to inform and they are expecting patient.  No medications given during time in short stay.  IV with NS was started. Will transfer to ED for further evaluation.   Thank you for this interesting consult.  I greatly enjoyed meeting Elizabeth Kidd and look forward to participating in their care.  A copy of this report was sent to the requesting provider on this  date.  Electronically Signed: Docia Barrier, PA 11/20/2017, 10:48 AM   I spent a total of    25 Minutes in face to face in clinical consultation, greater than 50% of which was counseling/coordinating care for obstructive jaundice.

## 2017-11-20 NOTE — ED Notes (Signed)
Bed: ZD66 Expected date:  Expected time:  Means of arrival:  Comments: Hold for [facility]

## 2017-11-21 ENCOUNTER — Other Ambulatory Visit: Payer: Medicare Other

## 2017-11-21 ENCOUNTER — Inpatient Hospital Stay (HOSPITAL_COMMUNITY): Payer: Medicare Other | Admitting: Anesthesiology

## 2017-11-21 ENCOUNTER — Encounter (HOSPITAL_COMMUNITY)
Admission: EM | Disposition: A | Discharge status: Hospice | Payer: Self-pay | Source: Home / Self Care | Attending: Internal Medicine

## 2017-11-21 ENCOUNTER — Encounter: Payer: Medicare Other | Admitting: Nutrition

## 2017-11-21 ENCOUNTER — Other Ambulatory Visit: Payer: Self-pay | Admitting: *Deleted

## 2017-11-21 ENCOUNTER — Encounter (HOSPITAL_COMMUNITY): Payer: Self-pay | Admitting: Anesthesiology

## 2017-11-21 DIAGNOSIS — D500 Iron deficiency anemia secondary to blood loss (chronic): Secondary | ICD-10-CM

## 2017-11-21 DIAGNOSIS — C7800 Secondary malignant neoplasm of unspecified lung: Secondary | ICD-10-CM

## 2017-11-21 DIAGNOSIS — D509 Iron deficiency anemia, unspecified: Secondary | ICD-10-CM

## 2017-11-21 DIAGNOSIS — K2971 Gastritis, unspecified, with bleeding: Secondary | ICD-10-CM

## 2017-11-21 DIAGNOSIS — I7300 Raynaud's syndrome without gangrene: Secondary | ICD-10-CM

## 2017-11-21 DIAGNOSIS — J9690 Respiratory failure, unspecified, unspecified whether with hypoxia or hypercapnia: Secondary | ICD-10-CM

## 2017-11-21 DIAGNOSIS — G9340 Encephalopathy, unspecified: Secondary | ICD-10-CM

## 2017-11-21 DIAGNOSIS — D62 Acute posthemorrhagic anemia: Secondary | ICD-10-CM

## 2017-11-21 DIAGNOSIS — C801 Malignant (primary) neoplasm, unspecified: Secondary | ICD-10-CM

## 2017-11-21 DIAGNOSIS — I8500 Esophageal varices without bleeding: Secondary | ICD-10-CM

## 2017-11-21 DIAGNOSIS — K922 Gastrointestinal hemorrhage, unspecified: Secondary | ICD-10-CM

## 2017-11-21 DIAGNOSIS — K8689 Other specified diseases of pancreas: Secondary | ICD-10-CM

## 2017-11-21 DIAGNOSIS — K831 Obstruction of bile duct: Secondary | ICD-10-CM

## 2017-11-21 HISTORY — PX: ESOPHAGOGASTRODUODENOSCOPY (EGD) WITH PROPOFOL: SHX5813

## 2017-11-21 LAB — BASIC METABOLIC PANEL
ANION GAP: 11 (ref 5–15)
BUN: 34 mg/dL — ABNORMAL HIGH (ref 6–20)
CHLORIDE: 105 mmol/L (ref 101–111)
CO2: 19 mmol/L — AB (ref 22–32)
Calcium: 8.7 mg/dL — ABNORMAL LOW (ref 8.9–10.3)
Creatinine, Ser: 0.68 mg/dL (ref 0.44–1.00)
GFR calc non Af Amer: >60 mL/min (ref >60)
Glucose, Bld: 91 mg/dL (ref 65–99)
POTASSIUM: 4.4 mmol/L (ref 3.5–5.1)
Sodium: 135 mmol/L (ref 135–145)

## 2017-11-21 LAB — PREPARE RBC (CROSSMATCH)

## 2017-11-21 LAB — CBC
HEMATOCRIT: 24.1 % — AB (ref 36.0–46.0)
HEMATOCRIT: 20.8 % — AB (ref 36.0–46.0)
HEMOGLOBIN: 8 g/dL — AB (ref 12.0–15.0)
Hemoglobin: 7 g/dL — ABNORMAL LOW (ref 12.0–15.0)
MCH: 33.3 pg (ref 26.0–34.0)
MCH: 33.7 pg (ref 26.0–34.0)
MCHC: 33.2 g/dL (ref 30.0–36.0)
MCHC: 33.7 g/dL (ref 30.0–36.0)
MCV: 100.4 fL — AB (ref 78.0–100.0)
MCV: 100 fL (ref 78.0–100.0)
Platelets: 196 10*3/uL (ref 150–400)
Platelets: 229 10*3/uL (ref 150–400)
RBC: 2.4 MIL/uL — AB (ref 3.87–5.11)
RBC: 2.08 MIL/uL — ABNORMAL LOW (ref 3.87–5.11)
RDW: 20.5 % — ABNORMAL HIGH (ref 11.5–15.5)
RDW: 20.9 % — AB (ref 11.5–15.5)
WBC: 7.4 10*3/uL (ref 4.0–10.5)
WBC: 12 10*3/uL — ABNORMAL HIGH (ref 4.0–10.5)

## 2017-11-21 LAB — MRSA PCR SCREENING: MRSA BY PCR: NEGATIVE

## 2017-11-21 LAB — HEMOGLOBIN AND HEMATOCRIT, BLOOD
HCT: 29 % — ABNORMAL LOW (ref 36.0–46.0)
HEMOGLOBIN: 9.9 g/dL — AB (ref 12.0–15.0)

## 2017-11-21 SURGERY — ESOPHAGOGASTRODUODENOSCOPY (EGD) WITH PROPOFOL
Anesthesia: Moderate Sedation

## 2017-11-21 MED ORDER — FENTANYL CITRATE (PF) 100 MCG/2ML IJ SOLN
INTRAMUSCULAR | Status: AC
  Filled 2017-11-21: qty 2

## 2017-11-21 MED ORDER — ACETAMINOPHEN 10 MG/ML IV SOLN
1000.0000 mg | Freq: Once | INTRAVENOUS | Status: AC
  Administered 2017-11-21: 1000 mg via INTRAVENOUS
  Filled 2017-11-21: qty 100

## 2017-11-21 MED ORDER — PROMETHAZINE HCL 25 MG/ML IJ SOLN: 12.5000 mg | Freq: Four times a day (QID) | INTRAMUSCULAR | Status: DC | PRN

## 2017-11-21 MED ORDER — ONDANSETRON HCL 4 MG/2ML IJ SOLN
4.0000 mg | INTRAMUSCULAR | Status: DC | PRN
Start: 2017-11-21 — End: 2017-11-23
  Administered 2017-11-21 – 2017-11-23 (×3): 4 mg via INTRAVENOUS
  Filled 2017-11-21 (×3): qty 2

## 2017-11-21 MED ORDER — PROMETHAZINE HCL 25 MG/ML IJ SOLN: 25.0000 mg | Freq: Four times a day (QID) | INTRAMUSCULAR | Status: DC | PRN

## 2017-11-21 MED ORDER — SODIUM CHLORIDE 0.9 % IV BOLUS (SEPSIS)
500.0000 mL | Freq: Once | INTRAVENOUS | Status: AC
  Administered 2017-11-21: 500 mL via INTRAVENOUS

## 2017-11-21 MED ORDER — METOCLOPRAMIDE HCL 5 MG/ML IJ SOLN
5.0000 mg | Freq: Once | INTRAMUSCULAR | Status: AC
  Administered 2017-11-21: 5 mg via INTRAVENOUS
  Filled 2017-11-21: qty 2

## 2017-11-21 MED ORDER — BUTAMBEN-TETRACAINE-BENZOCAINE 2-2-14 % EX AERO
INHALATION_SPRAY | CUTANEOUS | Status: DC | PRN
  Administered 2017-11-21: 2 via TOPICAL

## 2017-11-21 MED ORDER — MIDAZOLAM HCL 5 MG/ML IJ SOLN
INTRAMUSCULAR | Status: AC
  Filled 2017-11-21: qty 2

## 2017-11-21 MED ORDER — PROMETHAZINE HCL 25 MG/ML IJ SOLN
6.2500 mg | Freq: Four times a day (QID) | INTRAMUSCULAR | Status: DC | PRN
Start: 2017-11-21 — End: 2017-11-23
  Administered 2017-11-21 – 2017-11-22 (×2): 6.25 mg via INTRAVENOUS
  Filled 2017-11-21 (×2): qty 1

## 2017-11-21 MED ORDER — DIPHENHYDRAMINE HCL 50 MG/ML IJ SOLN
INTRAMUSCULAR | Status: AC
  Filled 2017-11-21: qty 1

## 2017-11-21 MED ORDER — MIDAZOLAM HCL 5 MG/ML IJ SOLN
INTRAMUSCULAR | Status: DC | PRN
  Administered 2017-11-21 (×6): 1 mg via INTRAVENOUS

## 2017-11-21 MED ORDER — ONDANSETRON HCL 4 MG PO TABS: 4.0000 mg | ORAL_TABLET | ORAL | Status: DC | PRN

## 2017-11-21 MED ORDER — SODIUM CHLORIDE 0.9 % IV SOLN: Freq: Once | INTRAVENOUS | Status: DC

## 2017-11-21 MED ORDER — DEXTROSE IN LACTATED RINGERS 5 % IV SOLN
INTRAVENOUS | Status: DC
  Administered 2017-11-21: 10:00:00 via INTRAVENOUS

## 2017-11-21 MED ORDER — FENTANYL CITRATE (PF) 100 MCG/2ML IJ SOLN
INTRAMUSCULAR | Status: DC | PRN
  Administered 2017-11-21 (×3): 12.5 ug via INTRAVENOUS

## 2017-11-21 MED ORDER — SODIUM CHLORIDE 0.9 % IV SOLN: INTRAVENOUS | Status: DC

## 2017-11-21 MED ORDER — SODIUM CHLORIDE 0.9 % IV SOLN
Freq: Once | INTRAVENOUS | Status: AC
  Administered 2017-11-21: 1000 mL via INTRAVENOUS

## 2017-11-21 SURGICAL SUPPLY — 15 items

## 2017-11-21 NOTE — Interval H&P Note (Signed)
History and Physical Interval Note:  11/21/2017 2:17 PM  Elizabeth Kidd  has presented today for surgery, with the diagnosis of Hematemesis  The various methods of treatment have been discussed with the patient and family. After consideration of risks, benefits and other options for treatment, the patient has consented to  Procedure(s): ESOPHAGOGASTRODUODENOSCOPY (EGD) WITH PROPOFOL (N/A) as a surgical intervention .  The patient's history has been reviewed, patient examined, no change in status, stable for surgery.  I have reviewed the patient's chart and labs.  Questions were answered to the patient's satisfaction.     Shari Natt

## 2017-11-21 NOTE — Patient Outreach (Signed)
[facility]) Care Management  11/21/2017  Elizabeth Kidd 1946/08/02 [id number]  Elizabeth Kidd remains hospitalized today after presentation to the ED with hematemesis via day hospital where she came in for a scheduled procedure yesterday. Elizabeth Kidd was admitted to the hospital and is being treated for acute blood loss anemia due to upper GI bleed complicated by obstructive jaundice.  I spoke yesterday with the [facility] RN for the purposes of collaboration on the plan of care for Elizabeth Kidd. Elizabeth Kidd's notation from her visit with Elizabeth Kidd today is noted.   Plan: I will continue to follow Elizabeth Kidd's hospital progress and will reach out to her by phone in follow up with plans to address any care coordination and care management needs in the community at discharge.    [facility]  [phone]

## 2017-11-21 NOTE — Progress Notes (Signed)
Patient post upper endoscopy with severe encephalopathy, poorly reactive, has developed hypotension.  Patient with significant jaundice, difficult to arouse, her lungs are clear to auscultation, heart S1-S2 present and tachycardic, abdomen soft, biliary drain in place, no lower extremity edema.  Her hemoglobin has dropped 1 g from 8 to 7.  EGD with red blood and large clot in the gastric fundus unable to clear, possible source of bleeding is gastric variceal hemorrhage.    Acute blood loss anemia due to upper GI bleed, gastric variceal hemorrhage.  1.  Cardiovascular. Transfuse 2 packed red blood cells, continue frequent hemoglobin and hematocrit monitoring, keep map more than 65, patient is hemodynamically unstable for IR procedure, she seems to be very poor prognosis.  2.  Pulmonary.  Impending respiratory failure due to acute bleed.  Patient currently not vomiting.  Will place patient on noninvasive mechanical ventilation, unloading respiratory muscles, that may improve her hemodynamics.  Prevent hypercapnic and hypoxic respiratory failure.   3.  Neurology.  Acute encephalopathy due to acute bleed, continue conservative therapy, frequent neuro checks and aspiration precautions.   I spoke with patient's daughter over the phone, explained her the rapidly deterioration of patient's condition, critically ill, poor prognosis, CODE STATUS has been addressed.  She will call back with a final decision.   Patient critically ill suspected ongoing upper GI hemorrhage, risk shock.  Continue noninvasive mechanical ventilation close hemodynamic monitoring and PRBC transfusion.   Critical care time 60 minutes.

## 2017-11-21 NOTE — Op Note (Signed)
[facility] Patient Name: Elizabeth Kidd Procedure Date: 11/21/2017 MRN: [id number] Attending MD: Mauri Pole , MD Date of Birth: [birth year] CSN: [id number] Age: 72 Admit Type: Inpatient Procedure:                Upper GI endoscopy Indications:              Active gastrointestinal bleeding, Hematemesis Providers:                Mauri Pole, MD, Carmie End, RN,                            Charolette Child, Technician, Cletis Athens, Technician Referring MD:              Medicines:                Monitored Anesthesia Care Complications:            No immediate complications. Estimated Blood Loss:     Estimated blood loss: none. Procedure:                Pre-Anesthesia Assessment:                           - Prior to the procedure, a History and Physical                            was performed, and patient medications and                            allergies were reviewed. The patient's tolerance of                            previous anesthesia was also reviewed. The risks                            and benefits of the procedure and the sedation                            options and risks were discussed with the patient.                            All questions were answered, and informed consent                            was obtained. Prior Anticoagulants: The patient has                            taken no previous anticoagulant or antiplatelet                            agents. ASA Grade Assessment: IV - A patient with                            severe systemic disease that is a constant threat  to life. After reviewing the risks and benefits,                            the patient was deemed in satisfactory condition to                            undergo the procedure.                           After obtaining informed consent, the endoscope was                            passed under direct vision. Throughout the                     procedure, the patient's blood pressure, pulse, and                            oxygen saturations were monitored continuously. The                            EG-[id number] ([id number]) scope was introduced through the                            mouth, and advanced to the third part of duodenum.                            The upper GI endoscopy was accomplished without                            difficulty. The patient tolerated the procedure                            well. Scope In: Scope Out: Findings:      Four columns of non-bleeding small (< 5 mm) varices were found in the       entire esophagus, 36 cm from the incisors. They were less than 5 mm in       largest diameter. No stigmata of recent bleeding were evident.      Red blood and large organized clot was found in the gastric fundus,       unable to clear it. GE junction appeared normal      The gastric antrum was normal.      The examined duodenum was normal. Impression:               - Non-bleeding small (< 5 mm) decompressed                            esophageal varices with no stigmata of recent                            bleeding.                           - Red blood and large clot in the gastric fundus,  unable to clear. Possible source of bleeding is                            gastric variceal hemorrhage                           - Normal antrum.                           - Normal examined duodenum.                           - No specimens collected. Moderate Sedation:      Moderate (conscious) sedation was administered by the endoscopy nurse       and supervised by the endoscopist. The following parameters were       monitored: oxygen saturation, heart rate, blood pressure, and response       to care. Total physician intraservice time was 32 minutes. Recommendation:           - Clear liquid diet.                           - Continue present medications.                           -  Continue Octreotide gtt                           - Monitor Hgb and transfuse as needed                           - Discussed with interventional radiology Dr Elizabeth Kidd, only option they can try is TIPS but                            unlikely to be successful and patient is not likely                            to tolerate TIPS                           - Called her step daughter Elizabeth Kidd and updated on                            patient's current status. She is planning to fly                            tomorrow AM from [place] care consult. Procedure Code(s):        --- Professional ---  16606, Esophagogastroduodenoscopy, flexible,                            transoral; diagnostic, including collection of                            specimen(s) by brushing or washing, when performed                            (separate procedure)                           99153, Moderate sedation services; each additional                            15 minutes intraservice time                           G0500, Moderate sedation services provided by the                            same physician or other qualified health care                            professional performing a gastrointestinal                            endoscopic service that sedation supports,                            requiring the presence of an independent trained                            observer to assist in the monitoring of the                            patient's level of consciousness and physiological                            status; initial 15 minutes of intra-service time;                            patient age 36 years or older (additional time may                            be reported with [phone], as appropriate) Diagnosis Code(s):        --- Professional ---                           I85.00, Esophageal varices without bleeding                            K92.2, Gastrointestinal hemorrhage, unspecified                           K92.0, Hematemesis CPT copyright  2016 American Medical Association. All rights reserved. The codes documented in this report are preliminary and upon coder review may  be revised to meet current compliance requirements. Mauri Pole, MD 11/21/2017 4:11:10 PM This report has been signed electronically. Number of Addenda: 0

## 2017-11-21 NOTE — Progress Notes (Signed)
Patient ID: Elizabeth Kidd, female   DOB: 1945-11-10, 72 y.o.   MRN: [id number] Request received for possible consideration of endovascular embolization on patient.  Latest imaging studies and history reviewed by Dr. Kathlene Cote.  By imaging patient appears to have occluded  confluence of portal/SMV and splenic veins and is not ideal candidate for TIPS.  If she were to stabilize could repeat CT abdomen/ pelvis with BRTO protocol but overall prognosis is poor regardless.  With IR suite currently down due to humidity problem any emergent endovascular procedures would need to be performed at [facility]. Above d/w Dr. Silverio Decamp.

## 2017-11-21 NOTE — Progress Notes (Signed)
  Oncology Nurse Navigator Documentation  Navigator Location: [facility] (11/21/17 1532)   )Navigator Encounter Type: Other(In-patient) (11/21/17 1532) Telephone: Outgoing Call (11/21/17 1532)    After discussion with Dr. Dia Sitter called step-daughter to provide current information and answer any questions she might have regarding her step-mother's condition. Shelle Iron will drive to [facility] in the morning.                       Interventions: Psycho-social support;Other (11/21/17 1532) Stayed with patient to offer support during her Upper Endoscopy.            Acuity: Level 2 (11/21/17 1532)         Time Spent with Patient: 60 (11/21/17 1532)

## 2017-11-21 NOTE — Progress Notes (Signed)
  Oncology Nurse Navigator Documentation  Navigator Location: [facility] (11/21/17 1000)   )                      Patient Visit Type: Inpatient (11/21/17 1000)       Interventions: Psycho-social support (11/21/17 1000) Met with patient in ICU to offer emotional support and encouragement. Patient states that she is tired of "fighting this". Patient's step-daughter will arrive Thursday evening from [place]. Spiritual Care consult placed.            Acuity: Level 2 (11/21/17 1000)         Time Spent with Patient: 30 (11/21/17 1000)

## 2017-11-21 NOTE — Progress Notes (Addendum)
PROGRESS NOTE    Elizabeth Kidd  QZR:[id number] DOB: May 27, 1946 DOA: 11/20/2017 PCP: Burnard Bunting, MD    Brief Narrative:  72 year old female who presented with hematemesis.  She does have the significant past medical history of pancreatic head cancer, nonresectable involving celiac trunk and SMA complicated by obstructive jaundice.  Patient was undergoing IR internalization of her biliary drain when she suddenly developed hematemesis.  She reported 2-3 days of melanotic stools.  Estimated amount of bloody emesis at 500 mL.  On her initial physical examination blood pressure 115/55, heart rate 102, respiratory rate 19, oxygen saturation 95%.  Moist mucous membranes, lungs were clear to auscultation bilaterally, no wheezing, rales or rhonchi, heart S1-S2 present tachycardic, no gallops, rubs or murmurs, the abdomen was soft and nontender, bowel sounds are positive, no lower extremity edema.  Sodium 133, potassium 3.7, chloride 99, bicarb 22, glucose 110, BUN 37, creatinine 0.68, alkaline phosphatase 173, AST 135, ALT 133, total bilirubin 16.7, white count 12.3, hemoglobin 9, hematocrit 26.6, platelets 313.  EKG sinus tachycardia 129 bpm, normal axis, normal intervals, poor R wave progression.  Patient was admitted to the hospital with the working diagnosis acute blood loss anemia due to upper GI bleed complicated by obstructive jaundice.   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 Acute blood loss worsening anemia due to upper GI bleed. Patient will have upper endoscopy today, will continue to follow on hb and Hct q 6 hours, patient is sp one unit prbc. Continue antiacid therapy with pantoprazole, and continuous infusion of Octeotride. NPO for now. Continue as needed IV antiemetics, zofran and phenergan. Will continue  transfuse if continue to decrease hb.   2. Pancreatic head cancer. Will continue conservative therapy, has external biliary drain in place, will follow on total bilirubins.   3. Transient hypotension. Will give bolus NS 500 ml and follow response. Continue hydration with dextrose and LR at 75 ml per hour. Keep MAP of 65.    DVT prophylaxis: scd  Code Status:  full Family Communication:  No family at the bedside Disposition Plan: home when stable.    Consultants:   GI  Procedures:     Antimicrobials:       Subjective: Patient with persistent nausea and hematemesis, abdominal discomfort and diarrhea. No dyspnea or chest pain.   Objective: Vitals:   11/21/17 0400 11/21/17 0500 11/21/17 0600 11/21/17 0710  BP: (!) 102/45 (!) 121/58 (!) 111/49   Pulse: 80 88 86   Resp: 11 15 14    Temp:    97.6 F (36.4 C)  TempSrc:    Oral  SpO2: 97% 96% 96%   Weight:      Height:        Intake/Output Summary (Last 24 hours) at 11/21/2017 0808 Last data filed at 11/21/2017 0721 Gross per 24 hour  Intake 1222.08 ml  Output 610 ml  Net 612.08 ml   Filed Weights   11/20/17 2315  Weight: 45.1 kg (99 lb 6.8 oz)    Examination:   General: Not in pain or dyspnea. Deconditioned and ill looking appearing Neurology: Awake and alert, non focal  E ENT: mild pallor, positive icterus, oral mucosa moist Cardiovascular: No JVD. S1-S2 present, rhythmic, no gallops, rubs, or murmurs. No lower extremity edema. Pulmonary: vesicular breath sounds bilaterally, adequate air movement, no wheezing, rhonchi  or rales. Gastrointestinal. Abdomen no organomegaly, non tender, no rebound or guarding. Positive biliary drain at the right upper quadrant Skin. No rashes Musculoskeletal: no joint deformities     Data Reviewed: I have personally reviewed following labs and imaging studies  CBC: Recent Labs  Lab 11/20/17 1005 11/21/17 0337  WBC 12.3* 7.4  HGB 9.0* 8.0*  HCT 26.6* 24.1*  MCV 104.7*  100.4*  PLT 313 756   Basic Metabolic Panel: Recent Labs  Lab 11/20/17 1005 11/21/17 0337  NA 133* 135  K 3.7 4.4  CL 99* 105  CO2 22 19*  GLUCOSE 110* 91  BUN 37* 34*  CREATININE 0.68 0.68  CALCIUM 9.4 8.7*   GFR: Estimated Creatinine Clearance: 43.4 mL/min (by C-G formula based on SCr of 0.68 mg/dL). Liver Function Tests: Recent Labs  Lab 11/20/17 1005  AST 135*  ALT 133*  ALKPHOS 173*  BILITOT 16.7*  PROT 7.5  ALBUMIN 3.4*   No results for input(s): LIPASE, AMYLASE in the last 168 hours. No results for input(s): AMMONIA in the last 168 hours. Coagulation Profile: Recent Labs  Lab 11/20/17 1005  INR 1.25   Cardiac Enzymes: No results for input(s): CKTOTAL, CKMB, CKMBINDEX, TROPONINI in the last 168 hours. BNP (last 3 results) No results for input(s): PROBNP in the last 8760 hours. HbA1C: No results for input(s): HGBA1C in the last 72 hours. CBG: No results for input(s): GLUCAP in the last 168 hours. Lipid Profile: No results for input(s): CHOL, HDL, LDLCALC, TRIG, CHOLHDL, LDLDIRECT in the last 72 hours. Thyroid Function Tests: No results for input(s): TSH, T4TOTAL, FREET4, T3FREE, THYROIDAB in the last 72 hours. Anemia Panel: No results for input(s): VITAMINB12, FOLATE, FERRITIN, TIBC, IRON, RETICCTPCT in the last 72 hours.    Radiology Studies: I have reviewed all of the imaging during this hospital visit personally     Scheduled Meds: . sodium chloride flush  3 mL Intravenous Q12H   Continuous Infusions: . sodium chloride    . octreotide  (SANDOSTATIN)    IV infusion 50 mcg/hr (11/21/17 0600)  . pantoprozole (PROTONIX) infusion 8 mg/hr (11/21/17 0600)     LOS: 1 day        Mauricio Gerome Apley, MD [facility] Pager [phone]

## 2017-11-22 ENCOUNTER — Encounter (HOSPITAL_COMMUNITY): Payer: Self-pay | Admitting: Gastroenterology

## 2017-11-22 ENCOUNTER — Ambulatory Visit (HOSPITAL_COMMUNITY): Admission: RE | Admit: 2017-11-22 | Payer: Medicare Other | Source: Ambulatory Visit

## 2017-11-22 DIAGNOSIS — Z515 Encounter for palliative care: Secondary | ICD-10-CM

## 2017-11-22 DIAGNOSIS — Z7189 Other specified counseling: Secondary | ICD-10-CM

## 2017-11-22 LAB — BPAM RBC
BLOOD PRODUCT EXPIRATION DATE: 201903192359
BLOOD PRODUCT EXPIRATION DATE: 201904012359
Blood Product Expiration Date: 201903312359
Blood Product Expiration Date: 201904022359
ISSUE DATE / TIME: 201903041203
ISSUE DATE / TIME: 201903050913
ISSUE DATE / TIME: 201903051523
ISSUE DATE / TIME: 201903051716
UNIT TYPE AND RH: 600
UNIT TYPE AND RH: 600
Unit Type and Rh: 600
Unit Type and Rh: 9500

## 2017-11-22 LAB — CBC
HCT: 26.7 % — ABNORMAL LOW (ref 36.0–46.0)
Hemoglobin: 9.4 g/dL — ABNORMAL LOW (ref 12.0–15.0)
MCH: 32.4 pg (ref 26.0–34.0)
MCHC: 35.2 g/dL (ref 30.0–36.0)
MCV: 92.1 fL (ref 78.0–100.0)
Platelets: 328 10*3/uL (ref 150–400)
RBC: 2.9 MIL/uL — ABNORMAL LOW (ref 3.87–5.11)
RDW: 21.5 % — ABNORMAL HIGH (ref 11.5–15.5)
WBC: 10.3 10*3/uL (ref 4.0–10.5)

## 2017-11-22 LAB — TYPE AND SCREEN
ABO/RH(D): A NEG
Antibody Screen: NEGATIVE
UNIT DIVISION: 0
UNIT DIVISION: 0
Unit division: 0
Unit division: 0

## 2017-11-22 LAB — BASIC METABOLIC PANEL
ANION GAP: 7 (ref 5–15)
BUN: 45 mg/dL — ABNORMAL HIGH (ref 6–20)
CHLORIDE: 113 mmol/L — AB (ref 101–111)
CO2: 19 mmol/L — ABNORMAL LOW (ref 22–32)
Calcium: 8.2 mg/dL — ABNORMAL LOW (ref 8.9–10.3)
Creatinine, Ser: 0.85 mg/dL (ref 0.44–1.00)
GFR calc non Af Amer: >60 mL/min (ref >60)
Glucose, Bld: 171 mg/dL — ABNORMAL HIGH (ref 65–99)
POTASSIUM: 4.5 mmol/L (ref 3.5–5.1)
SODIUM: 139 mmol/L (ref 135–145)

## 2017-11-22 LAB — CBC WITH DIFFERENTIAL/PLATELET
BASOS ABS: 0 10*3/uL (ref 0.0–0.1)
Basophils Relative: 0 %
Eosinophils Absolute: 0.1 10*3/uL (ref 0.0–0.7)
Eosinophils Relative: 1 %
HEMATOCRIT: 31.5 % — AB (ref 36.0–46.0)
Hemoglobin: 10.8 g/dL — ABNORMAL LOW (ref 12.0–15.0)
LYMPHS PCT: 4 %
Lymphs Abs: 0.4 10*3/uL — ABNORMAL LOW (ref 0.7–4.0)
MCH: 31.7 pg (ref 26.0–34.0)
MCHC: 34.3 g/dL (ref 30.0–36.0)
MCV: 92.4 fL (ref 78.0–100.0)
MONOS PCT: 5 %
Monocytes Absolute: 0.6 10*3/uL (ref 0.1–1.0)
NEUTROS PCT: 90 %
Neutro Abs: 10 10*3/uL — ABNORMAL HIGH (ref 1.7–7.7)
Platelets: 245 10*3/uL (ref 150–400)
RBC: 3.41 MIL/uL — AB (ref 3.87–5.11)
RDW: 21.2 % — AB (ref 11.5–15.5)
WBC: 11.1 10*3/uL — AB (ref 4.0–10.5)

## 2017-11-22 MED ORDER — PRO-STAT SUGAR FREE PO LIQD
30.0000 mL | Freq: Two times a day (BID) | ORAL | Status: DC
  Administered 2017-11-23: 30 mL via ORAL
  Filled 2017-11-22: qty 30

## 2017-11-22 MED ORDER — MORPHINE SULFATE (PF) 4 MG/ML IV SOLN: 2.0000 mg | Freq: Four times a day (QID) | INTRAVENOUS | Status: DC

## 2017-11-22 MED ORDER — MORPHINE SULFATE (PF) 4 MG/ML IV SOLN
INTRAVENOUS | Status: AC
  Administered 2017-11-22: 4 mg
  Filled 2017-11-22: qty 1

## 2017-11-22 MED ORDER — MORPHINE SULFATE (PF) 4 MG/ML IV SOLN
2.0000 mg | INTRAVENOUS | Status: DC | PRN
Start: 2017-11-22 — End: 2017-11-23
  Administered 2017-11-23: 2 mg via INTRAVENOUS
  Filled 2017-11-22 (×2): qty 1

## 2017-11-22 MED ORDER — LORAZEPAM 2 MG/ML IJ SOLN: 0.5000 mg | INTRAMUSCULAR | Status: DC | PRN

## 2017-11-22 MED ORDER — MORPHINE SULFATE (PF) 4 MG/ML IV SOLN: 4.0000 mg | INTRAVENOUS | Status: DC | PRN

## 2017-11-22 MED ORDER — BOOST / RESOURCE BREEZE PO LIQD CUSTOM
1.0000 | Freq: Three times a day (TID) | ORAL | Status: DC
  Administered 2017-11-22 – 2017-11-23 (×2): 1 via ORAL

## 2017-11-22 NOTE — Progress Notes (Signed)
  Oncology Nurse Navigator Documentation  Navigator Location: [facility] (11/22/17 1518)   )Navigator Encounter Type: Other(Inpatient ) (11/22/17 1518)  Met with step-daughter, grand-daughter to offer support.                   Patient Visit Type: Inpatient (11/22/17 1518)       Interventions: Psycho-social support;Education (11/22/17 1518)     Education Method: Verbal (11/22/17 1518)      Acuity: Level 3 (11/22/17 1518)         Time Spent with Patient: 30 (11/22/17 1518)

## 2017-11-22 NOTE — Consult Note (Signed)
Consultation Note Date: 11/22/2017   Patient Name: Elizabeth Kidd  DOB: 02-10-1946  MRN: [id number]  Age / Sex: 72 y.o., female  PCP: Burnard Bunting, MD Referring Physician: Aileen Fass, Tammi Klippel, MD  Reason for Consultation: Establishing goals of care  HPI/Patient Profile: 72 y.o. female  with past medical history of IBS, osteoarthritis, osteopenia, Raynaud's disease with recent diagnosed pancreatic cancer with mets to lung admitted on 11/20/2017 with vomiting blood. Patient has consulted with oncology, GI, and IR and unfortunately not many options to reverse bleeding (now with GIB). 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 Prognosis extremely poor. Onc has recommended hospice and comfort care.   Clinical Assessment and Goals of Care: I met today with Ms. Ellwood along with her stepdaughter and granddaughter who have arrived today after driving all night from [place]. They all understand poor prognosis and lack of options to reverse or improve her condition or prognosis. We fully discussed this and what this means and Ms. Olvera is clear that her desire is to transition to hospice facility in [facility] so she can be close to her church community and friends there. We did discuss the option of home with hospice but they all agree given her care needs that hospice facility would be a better option. We discussed hospice philosophy and comfort care.   Ms. Braggs talks of not being fearful of death and looking forward to being with her husband and with Jesus. She is saddened by leaving her dog (only 6 months) and her family. She talks of her stepdaughter and granddaughter and how they have always been good family to her even after her husband passed away. Her only concern is that hospice will maintain her drain by flushing  and dressing changes and I reassured her that this should be no problem for them.   Ms. Lasota became restless discussing this matter so I offered emotional support and helped to clean her and leave her to get some rest. RN administered morphine for pain.   Primary Decision Maker PATIENT    SUMMARY OF RECOMMENDATIONS   - DNR - Comfort care - Hopeful for transition to [place]  Code Status/Advance Care Planning:  DNR   Symptom Management:   Pain: Morphine 2 mg IV every 6 hours. Morphine 2-4 mg IV every 2 hours prn.   Anxiety: Lorazepam 0.5-1 mg IV every 4 hours prn.   Low threshold for morphine infusion with severe GIB.   Palliative Prophylaxis:   Aspiration, Bowel Regimen, Delirium Protocol, Frequent Pain Assessment, Oral Care and Turn Reposition  Additional Recommendations (Limitations, Scope, Preferences):  Full Comfort Care  Psycho-social/Spiritual:   Desire for further Chaplaincy support:yes  Additional Recommendations: Caregiving  Support/Resources, Education on Hospice and Grief/Bereavement Support  Prognosis:   < 2 weeks  Discharge Planning: Hospice facility      Primary Diagnoses: Present on Admission: . GIB (gastrointestinal bleeding) . 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  Obstructive jaundice due to malignant neoplasm ([place]) . Raynaud's disease   I have reviewed the medical record, interviewed the patient and family, and examined the patient. The following aspects are pertinent.  Past Medical History:  Diagnosis Date  . Complication of anesthesia   . IBS (irritable bowel syndrome)   . Osteoarthritis   . Osteopenia   . PONV (postoperative nausea and vomiting)   . Raynaud's disease    Social History   Socioeconomic History  . Marital status: Widowed    Spouse name: None  . Number of children: None  . Years of education: None  . Highest education level: None  Social Needs  . Financial resource strain: None  .  Food insecurity - worry: None  . Food insecurity - inability: None  . Transportation needs - medical: None  . Transportation needs - non-medical: None  Occupational History  . None  Tobacco Use  . Smoking status: Never Smoker  . Smokeless tobacco: Never Used  Substance and Sexual Activity  . Alcohol use: No  . Drug use: No  . Sexual activity: None  Other Topics Concern  . None  Social History Narrative  . None   Family History  Problem Relation Age of Onset  . Breast cancer Mother 33  . Lupus Sister   . Colon cancer Neg Hx   . Pancreatic cancer Neg Hx   . Stomach cancer Neg Hx   . Rectal cancer Neg Hx    Scheduled Meds: Continuous Infusions: . sodium chloride    . sodium chloride    . sodium chloride    . dextrose 5% lactated ringers 75 mL/hr at 11/22/17 0600  . octreotide  (SANDOSTATIN)    IV infusion 50 mcg/hr (11/22/17 1249)  . pantoprozole (PROTONIX) infusion 8 mg/hr (11/22/17 1249)   PRN Meds:.morphine injection, ondansetron **OR** ondansetron (ZOFRAN) IV, promethazine Allergies  Allergen Reactions  . Anesthesia S-I-40 [Propofol] Nausea Only   Review of Systems  Constitutional: Positive for activity change, appetite change and fatigue.  Gastrointestinal: Positive for abdominal pain, blood in stool and vomiting.  Neurological: Positive for weakness.    Physical Exam  Constitutional: She is oriented to person, place, and time. She appears well-developed.  HENT:  Head: Normocephalic and atraumatic.  Cardiovascular: Tachycardia present.  Pulmonary/Chest: Effort normal. No accessory muscle usage. No tachypnea. No respiratory distress.  Abdominal:  + right biliary drain  Neurological: She is alert and oriented to person, place, and time.  Nursing note and vitals reviewed.   Vital Signs: BP 110/70   Pulse (!) 128   Temp 97.7 F (36.5 C) (Oral)   Resp 14   Ht '4\' 11"'$  (1.499 m)   Wt 45.1 kg (99 lb 6.8 oz)   SpO2 99%   BMI 20.08 kg/m  Pain Assessment:  0-10 POSS *See Group Information*: 1-Acceptable,Awake and alert Pain Score: 5    SpO2: SpO2: 99 % O2 Device:SpO2: 99 % O2 Flow Rate: .O2 Flow Rate (L/min): 4 L/min  IO: Intake/output summary:   Intake/Output Summary (Last 24 hours) at 11/22/2017 1520 Last data filed at 11/22/2017 1424 Gross per 24 hour  Intake 6383.33 ml  Output 330 ml  Net 6053.33 ml    LBM: Last BM Date: 11/22/17 Baseline Weight: Weight: 45.1 kg (99 lb 6.8 oz) Most recent weight: Weight: 45.1 kg (99 lb 6.8 oz)     Palliative Assessment/Data:     Time Total: 75 min Greater than 50%  of this time was spent counseling and  coordinating care related to the above assessment and plan.  Signed by: Vinie Sill, NP Palliative Medicine Team Pager # [phone] (M-F 8a-5p) Team Phone # [phone] (Nights/Weekends)

## 2017-11-22 NOTE — Progress Notes (Signed)
Elizabeth Kidd   DOB:[birth year]   BD#:[id number]   STM#:[id number]  Oncology follow up  Subjective: Pt was seen by me last week for newly diagnosed metastatic pancreatic ca. She was admitted for severe GI bleeding yesterday. Bedside EGD attempted but could not identify the source of bleeding due to excessive blood in stomach. IR evaluated her and did not feel she is a candidate for TIP.  I saw pt in ICU, she is very lethargic, on bipap, able to open eyes but does not follow commends or answer questions.    Objective:  Vitals:   11/22/17 0500 11/22/17 0600  BP: (!) 129/56 (!) 107/54  Pulse: 94 99  Resp: (!) 22 11  Temp:    SpO2: 96% 96%    Body mass index is 20.08 kg/m.  Intake/Output Summary (Last 24 hours) at 11/22/2017 0636 Last data filed at 11/22/2017 0600 Gross per 24 hour  Intake 4854.58 ml  Output 550 ml  Net 4304.58 ml     (+) jaundice   Lungs clear -- no rales or rhonchi  Heart regular rate and rhythm  Abdomen soft    CBG (last 3)  No results for input(s): GLUCAP in the last 72 hours.   Labs:  Lab Results  Component Value Date   WBC 11.1 (H) 11/22/2017   HGB 10.8 (L) 11/22/2017   HCT 31.5 (L) 11/22/2017   MCV 92.4 11/22/2017   PLT 245 11/22/2017   NEUTROABS 10.0 (H) 11/22/2017    Urine Studies No results for input(s): UHGB, CRYS in the last 72 hours.  Invalid input(s): UACOL, UAPR, USPG, UPH, UTP, UGL, UKET, UBIL, UNIT, UROB, ULEU, UEPI, UWBC, [place], [place], [place], [place], [place]  Basic Metabolic Panel: Recent Labs  Lab 11/20/17 1005 11/21/17 0337 11/22/17 0244  NA 133* 135 139  K 3.7 4.4 4.5  CL 99* 105 113*  CO2 22 19* 19*  GLUCOSE 110* 91 171*  BUN 37* 34* 45*  CREATININE 0.68 0.68 0.85  CALCIUM 9.4 8.7* 8.2*   GFR Estimated Creatinine Clearance: 40.8 mL/min (by C-G formula based on SCr of 0.85 mg/dL). Liver Function Tests: Recent Labs  Lab 11/20/17 1005  AST 135*  ALT 133*  ALKPHOS 173*  BILITOT 16.7*  PROT 7.5  ALBUMIN 3.4*   No results  for input(s): LIPASE, AMYLASE in the last 168 hours. No results for input(s): AMMONIA in the last 168 hours. Coagulation profile Recent Labs  Lab 11/20/17 1005  INR 1.25    CBC: Recent Labs  Lab 11/20/17 1005 11/21/17 0337 11/21/17 1013 11/21/17 2054 11/22/17 0244  WBC 12.3* 7.4 12.0*  --  11.1*  NEUTROABS  --   --   --   --  10.0*  HGB 9.0* 8.0* 7.0* 9.9* 10.8*  HCT 26.6* 24.1* 20.8* 29.0* 31.5*  MCV 104.7* 100.4* 100.0  --  92.4  PLT 313 196 229  --  245   Cardiac Enzymes: No results for input(s): CKTOTAL, CKMB, CKMBINDEX, TROPONINI in the last 168 hours. BNP: Invalid input(s): POCBNP CBG: No results for input(s): GLUCAP in the last 168 hours. D-Dimer No results for input(s): DDIMER in the last 72 hours. Hgb A1c No results for input(s): HGBA1C in the last 72 hours. Lipid Profile No results for input(s): CHOL, HDL, LDLCALC, TRIG, CHOLHDL, LDLDIRECT in the last 72 hours. Thyroid function studies No results for input(s): TSH, T4TOTAL, T3FREE, THYROIDAB in the last 72 hours.  Invalid input(s): FREET3 Anemia work up No results for input(s): VITAMINB12, FOLATE, FERRITIN, TIBC, IRON,  RETICCTPCT in the last 72 hours. Microbiology Recent Results (from the past 240 hour(s))  MRSA PCR Screening     Status: None   Collection Time: 11/20/17 11:26 PM  Result Value Ref Range Status   MRSA by PCR NEGATIVE NEGATIVE Final    Comment:        The GeneXpert MRSA Assay (FDA approved for NASAL specimens only), is one component of a comprehensive MRSA colonization surveillance program. It is not intended to diagnose MRSA infection nor to guide or monitor treatment for MRSA infections. Performed at [facility], [place] [address] [place], [place] [address]       Studies:  No results found.  Assessment: 72 y.o. with metastatic pancreatic cancer to nodes and lung, lives alone, admitted for severe GI bleeding   1. Upper GI bleeding 2. Anemia secondary GI  bleeding and iron deficiency  3. encephalopathy  4. Respiratory failure   Plan:  -Her severe active bleeding is possible related to her pancreatic tumor invading duodenoium or secondary portal hypertension. Unfortunately no intervention was available to stop the bleeding at this point -Given her underline metastatic cancer, pending multi-organ failure, comfort alone is the best approach at this point. Dr. Cathlean Sauer has spoken with her step-daughter who is on her way from [facility] to here, code status has been discussed  -I will f/u, hope to meet her step-daughter tomorrow   Truitt Merle, MD 11/21/2017 7pm

## 2017-11-22 NOTE — Progress Notes (Signed)
Oncology brief follow-up note  I saw patient in the ICU this morning.  She is awake and alert and answer questions appropriately.  She is off BiPAP, but complains of dyspnea.    CODE STATUS discussed with patient, she agrees with DNR/DNI.   Her case was discussed in our GI tumor board this morning, interventional radiologist Dr. Vernard Gambles has reviewed her imaging again, does not think she is a candidate for vascular procedure to help her GI bleeding.   We discussed hospice, she agrees to see palliative care, please consult. I will cancel her chemo which is scheduled for tomorrow.   I will continue to f/u  Elizabeth Kidd  11/22/2017

## 2017-11-22 NOTE — Progress Notes (Signed)
Referring Physician(s): Feng,Y/Nandigam,K  Supervising Physician: Arne Cleveland  Patient Status:  [facility] - In-pt  Chief Complaint: Pancreatic cancer, biliary obstruction/jaundice   Subjective: Pt awake/alert; has had 1 episode of bloody emesis this am and some bloody diarrhea; heart rate tachy, denies fever, HA,CP, dyspnea, back pain; has some lower abd discomfort and soreness at biliary drain site   Allergies: Anesthesia s-i-40 [propofol]  Medications: Prior to Admission medications   Medication Sig Start Date End Date Taking? Authorizing Provider  ibuprofen (ADVIL,MOTRIN) 200 MG tablet Take 200 mg by mouth every 6 (six) hours as needed.   Yes [provider]  Polyethyl Glycol-Propyl Glycol (SYSTANE OP) Place 1 drop into both eyes 3 (three) times daily as needed (for dry eyes).   Yes [provider]  prochlorperazine (COMPAZINE) 10 MG tablet Take 1 tablet (10 mg total) by mouth every 6 (six) hours as needed (Nausea or vomiting). 11/14/17  Yes Truitt Merle, MD  traMADol (ULTRAM) 50 MG tablet Take 1 tablet (50 mg total) by mouth every 6 (six) hours as needed. Patient taking differently: Take 50 mg by mouth every 6 (six) hours as needed for moderate pain.  11/14/17  Yes Truitt Merle, MD  ondansetron (ZOFRAN) 4 MG tablet Take 1 tablet (4 mg total) by mouth every 6 (six) hours as needed for nausea. Patient not taking: Reported on 11/14/2017 11/12/17   Florencia Reasons, MD     Vital Signs: BP 110/70   Pulse (!) 128   Temp 97.7 F (36.5 C) (Oral)   Resp 14   Ht 4\' 11"  (1.499 m)   Wt 99 lb 6.8 oz (45.1 kg)   SpO2 99%   BMI 20.08 kg/m   Physical Exam: Jaundiced ;rt biliary drain intact, insertion site okay, output 20-30 cc green bile; drain irrigated without difficulty or reflux  Imaging: No results found.  Labs:  CBC: Recent Labs    11/21/17 0337 11/21/17 1013 11/21/17 2054 11/22/17 0244 11/22/17 0955  WBC 7.4 12.0*  --  11.1* 10.3  HGB 8.0* 7.0* 9.9* 10.8*  9.4*  HCT 24.1* 20.8* 29.0* 31.5* 26.7*  PLT 196 229  --  245 328    COAGS: Recent Labs    10/31/17 1443 11/20/17 1005  INR 1.2* 1.25  APTT  --  33    BMP: Recent Labs    11/12/17 0502 11/20/17 1005 11/21/17 0337 11/22/17 0244  NA 135 133* 135 139  K 4.3 3.7 4.4 4.5  CL 101 99* 105 113*  CO2 26 22 19* 19*  GLUCOSE 117* 110* 91 171*  BUN 14 37* 34* 45*  CALCIUM 8.6* 9.4 8.7* 8.2*  CREATININE 0.82 0.68 0.68 0.85  GFRNONAA >60 >60 >60 >60  GFRAA >60 >60 >60 >60    LIVER FUNCTION TESTS: Recent Labs    11/10/17 0353 11/11/17 0431 11/12/17 0502 11/20/17 1005  BILITOT 13.0* 10.0* 8.9* 16.7*  AST 113* 108* 92* 135*  ALT 89* 88* 82* 133*  ALKPHOS 313* 299* 267* 173*  PROT 6.4* 6.6 6.5 7.5  ALBUMIN 2.9* 3.0* 2.9* 3.4*    Assessment and Plan: Patient with history of pancreatic cancer with biliary obstruction, jaundice, recent hematemesis; status post external biliary drain placement on 11/10/17; afebrile, hemoglobin 9.4, WBC normal, creatinine normal, last total bilirubin 3/4 was 16.7; latest imaging studies have been reviewed by both Drs. Vernard Gambles and [place]; patient not good candidate for TIPS or any additional vascular procedure to control GI bleeding.  Prognosis poor; palliative care evaluation pending.  Would continue with external biliary drain to bag and flush once to twice daily.   Electronically Signed: D. Rowe Robert, PA-C 11/22/2017, 4:21 PM   I spent a total of 15 minutes at the the patient's bedside AND on the patient's hospital floor or unit, greater than 50% of which was counseling/coordinating care for biliary drain    Patient ID: Cathlean Sauer, female   DOB: 09-Apr-1946, 72 y.o.   MRN: [id number]

## 2017-11-22 NOTE — Progress Notes (Signed)
Initial Nutrition Assessment  DOCUMENTATION CODES:   Non-severe (moderate) malnutrition in context of chronic illness  INTERVENTION:   Boost Breeze po TID, each supplement provides 250 kcal and 9 grams of protein  Prostat BID, each supplement provides 100 kcal and 15 grams of protein    NUTRITION DIAGNOSIS:   Moderate Malnutrition related to chronic illness(Cancer) as evidenced by moderate fat depletion, moderate muscle depletion.   GOAL:   Patient will meet greater than or equal to 90% of their needs   MONITOR:   PO intake, Supplement acceptance, Diet advancement, Labs, Weight trends  REASON FOR ASSESSMENT:   Malnutrition Screening Tool    ASSESSMENT:   72 y.o. 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 PMH pancreatic head cancer Raynaud's dz, IBS.   S/w pt who reports: -feeling very weak and her muscles are "wasting away" -tolerated her CLD tray ~30 min prior to nutrition assessment visit. Consumed >75% (Jello, chicken broth, italian ice, 2 ginger ales) -Prior to today's meal, pt had not eaten since Sunday 3/3 (ate yogurt) -Pt appetite has been very poor since mid-February ---Consuming ~25% of meals ---Meals are all full liquid consistency (Ensure x 1.5/day, yogurt, ginger ale, V8) ---Getting full easily, but no issues with swallowing/chewing  Pt was experiencing worsening pain during nutrition assessment and needed privacy for bed pan; interview questions were not able to be completed.   Pt reports losing wt in past few months, consistent with medical record 5% wt loss in past 5 days NPO past 3 days with 25% PO intake for 2.5 weeks PTA  Wt Readings from Last 10 Encounters:  11/20/17 99 lb 6.8 oz (45.1 kg)  11/14/17 104 lb 4.8 oz (47.3 kg)   11/12/17 103 lb 3.2 oz (46.8 kg)  11/07/17 106 lb (48.1 kg)  10/31/17 106 lb 6.4 oz (48.3 kg)  10/04/14 117 lb (53.1 kg)  01/21/14 117 lb (53.1 kg)  12/31/13 117 lb (53.1 kg)    3/5 Per Attending MD  -Pt is critically ill: -High risk for respiratory failure d/t acute bleed (on noninvasive mechanical ventilation) --Hemodynamically unstable  3/6 Per Oncology: -Pt is off BiPAP -Code status is DNR -No plan to address GI bleed with vascular surgery -Chemotherapy to treat cancer is dc'd -Pt agrees to see palliative for hospice options  3/6 Per General MD Progress Note  -Pt has been vomiting bright red blood/bloody clots and has bloody stool this am.  -Pt had EGD but unable to find or resolve the source of bleeding and pt also unstable for endovascular procedure  --MAP= 69 --Dias BP =54   Pt also has biliary tube in place as of 3/4. No notable output seen in MD progress notes.   Medications NaCl line for PRN blood infusion @ 87ml/hr D5% in lactated ringers @ 75 ml/hr Octreotide prontonix  Labs:  Cl 113 H CO2 19 L Glucose 171 H BUN 45 H Improving CBC:  CBC Latest Ref Rng & Units 11/22/2017 11/22/2017 11/21/2017  WBC 4.0 - 10.5 K/uL 10.3 11.1(H) -  Hemoglobin 12.0 - 15.0 g/dL 9.4(L) 10.8(L) 9.9(L)  Hematocrit 36.0 - 46.0 % 26.7(L) 31.5(L) 29.0(L)      NUTRITION - FOCUSED PHYSICAL EXAM:    Most Recent Value  Orbital Region  Moderate depletion  Upper Arm Region  Moderate depletion  Thoracic and Lumbar Region  Moderate depletion  Buccal Region  Mild depletion  Temple Region  Moderate depletion  Clavicle Bone Region  Moderate depletion  Clavicle and Acromion Bone Region  Moderate depletion  Scapular Bone Region  Unable to assess  Dorsal Hand  Mild depletion  Anterior Thigh Region  Moderate depletion  Posterior Calf Region  Moderate depletion  Edema (RD Assessment)  None  Hair  Reviewed  Eyes  Reviewed  Mouth  Reviewed  Skin  Other (Comment) [jaundice]  Nails   Reviewed       Diet Order:  Diet clear liquid Room service appropriate? Yes; Fluid consistency: Thin  EDUCATION NEEDS:   No education needs have been identified at this time  Skin:  Skin Assessment: Skin Integrity Issues: Skin Integrity Issues:: Other (Comment) Other: Jaundice; access site (biliary tube in place)  Last BM:  3/6 Type 6 (mushy) with bright red blood; large  Height:   Ht Readings from Last 1 Encounters:  11/20/17 4\' 11"  (1.499 m)    Weight:   Wt Readings from Last 1 Encounters:  11/20/17 99 lb 6.8 oz (45.1 kg)    Ideal Body Weight:  44.6 kg  BMI:  Body mass index is 20.08 kg/m.  Estimated Nutritional Needs:   Kcal:  1578-1,800 (35-40 kcal/kg)  Protein:  85-95 grams/day  Fluid:  >=1.6 L   Edmonia Lynch, MS Dietetic Intern Pager: [phone]

## 2017-11-22 NOTE — Progress Notes (Addendum)
[facility] Progress Note   Chief Complaint:   hematemesis   SUBJECTIVE:    no abdominal pain. She vomited clots this am and had episode of bloody diarrhea.    ASSESSMENT AND PLAN:   72 yo female with pancreatic cancer s/p PTC bilary drain placement. Admitted with hematemesis. EGD yesterday revealed 4 columns of non-bleeding small esophageal varices. Stomach full of blood / clots. She has acute portal HTN from portal vein involvement of cancer. Suspect bleeding from gastric varices. Vomiting clots / bloody stool this am.  -IR doesn't feel good candidate for TIPS.  BRTO entertained but benefit not clear and her prognosis is poor. Patient is interested in Hospice but does request to talk with Oncology first.  -hgb 9.4, down from 10.8 around 3 am. Still could have been old blood that she vomited this am but HR up this am and BP down slightly. Will increase IVF to 100 ml /l hr. Continue octreotide and PPI gtts   OBJECTIVE:    Vital signs in last 24 hours: Temp:  [97.4 F (36.3 C)-98.9 F (37.2 C)] 97.4 F (36.3 C) (03/06 0800) Pulse Rate:  [88-120] 99 (03/06 0600) Resp:  [10-33] 11 (03/06 0600) BP: (82-134)/(29-91) 107/54 (03/06 0600) SpO2:  [95 %-100 %] 96 % (03/06 0600) FiO2 (%):  [30 %] 30 % (03/05 1735) Last BM Date: 11/22/17 General:   Alert, thin white female in NAD EENT:  Normal hearing, icteric sclera.  Heart:  Sinus tachycardia. No lower extremity edema Pulm: Normal respiratory effort Abdomen:  Soft, mildly distended, tympanitic, normal bowel sounds.    Neurologic:  Alert and  oriented x4;  grossly normal neurologically. Psych:  Pleasant, cooperative.  Normal mood and affect.   Intake/Output from previous day: 03/05 0701 - 03/06 0700 In: 5569.6 [P.O.:715; I.V.:4073.3; Blood:781.3] Out: 550 [Emesis/NG output:400; Drains:150] Intake/Output this shift: Total I/O In: 20 [Other:20] Out: 80 [Drains:80]  Lab Results: Recent Labs    11/21/17 0337  11/21/17 1013 11/21/17 2054 11/22/17 0244  WBC 7.4 12.0*  --  11.1*  HGB 8.0* 7.0* 9.9* 10.8*  HCT 24.1* 20.8* 29.0* 31.5*  PLT 196 229  --  245   BMET Recent Labs    11/20/17 1005 11/21/17 0337 11/22/17 0244  NA 133* 135 139  K 3.7 4.4 4.5  CL 99* 105 113*  CO2 22 19* 19*  GLUCOSE 110* 91 171*  BUN 37* 34* 45*  CREATININE 0.68 0.68 0.85  CALCIUM 9.4 8.7* 8.2*   LFT Recent Labs    11/20/17 1005  PROT 7.5  ALBUMIN 3.4*  AST 135*  ALT 133*  ALKPHOS 173*  BILITOT 16.7*   PT/INR Recent Labs    11/20/17 1005  LABPROT 15.6*  INR 1.25    Discharge Planning Diet: as tolerated Anticoagulation and antiplatelets:  N/A Follow up: as needed   Principal Problem:   GIB (gastrointestinal bleeding) Active Problems:   Obstructive jaundice due to malignant neoplasm ([place])   Raynaud's disease   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 I agree with the Advanced Practitioner's note, impression and recommendations.  Hgb improved with pRBC transfusion, had additional episode of hematemesis this morning.  Unable to intervene endoscopically. Not a candidate for TIPS.  Patient doesn't not want to additional  invasive procedure if it doesn't improve her condition Overall poor prognosis.  She would like to speak to Oncologist first and is planning to go towards Hospice care if has no feasible options to treat the cancer or improve her prognosis. Available for any questions or concerns  K Denzil Magnuson, MD [phone] Mon-Fri 8a-5p [phone] after 5p, weekends, holidays

## 2017-11-22 NOTE — Progress Notes (Signed)
PROGRESS NOTE [facility]   Elizabeth Kidd   CVE:[id number] DOB: 1945/11/14  DOA: 11/20/2017 PCP: Burnard Bunting, MD   Brief Narrative:  Elizabeth Kidd is a 72 year old female with medical history of pancreatic head cancer, involving celiac trunk and SMA complicated by obstructive jaundice.  Presented to the emergency department with hematemesis.  Patient was undergoing IR internalization of her biliary drain when she suddenly developed hematemesis, she also report melanotic stool to 3 days prior to admission.  On initial evaluation patient was found to be anemic with hemoglobin of 9, total bilirubin of 16.7, vital signs stable.  Patient was admitted with working diagnosis of acute blood loss anemia due to upper GI bleed with complicated jaundice.  GI was consulted, patient underwent EGD but unable to find or resolve the source of bleeding.  IR was consulted, patient is not a candidate for TIPS and patient was unstable for endovascular procedure.  Patient is status post 3 units of PRBCs.  Oncology following.  Palliative care has been consulted.  Subjective: Patient seen and examined, report hematemesis and bloody stools this morning.  Denies abdominal pain, chest pain and dizziness.  Assessment & Plan: Acute blood loss anemia due to GI bleed Felt to be secondary to pancreatic cancer invasion of the duodenum GI unable to resolve bleeding endoscopically. Oncology discussed with IR, patient is not a candidate for vascular procedure or TIPS Oncology recommending holding chemotherapy for now.  Patient with very poor prognosis I have discussed hospice services with patient and family and they agreed to see palliative care. Patient has a will and she is DNR/DNI. Hemoglobin stable today after 3 units of PRBCs. Check H&H in a.m. or sooner if continues with hematemesis/bloody stools Transfuse if hemoglobin less than 7  Pancreatic head cancer with obstructive jaundice Patient has a sternal  biliary drain in place goal, total bili elevated Continue management per oncology  Goal of care Patient does not wish any invasive procedure if it does not improve her condition.  Overall prognosis.  I have consulted palliative care to discuss hospice option.  We will continue to monitor for 24-48 hours to see if bleeding stabilizes and patient is able to go home with hospice versus residential hospice  DVT prophylaxis: SCDs Code Status: DNR Family Communication: Family at bedside Disposition Plan: Home with hospice versus residential hospice and 24-48 hours if bleeding has stabilized  Consultants:   GI  Oncology  IR  Procedures:   EGD  Antimicrobials:  None   Objective: Vitals:   11/22/17 0417 11/22/17 0500 11/22/17 0600 11/22/17 0800  BP:  (!) 129/56 (!) 107/54   Pulse:  94 99   Resp:  (!) 22 11   Temp: (!) 97.5 F (36.4 C)   (!) 97.4 F (36.3 C)  TempSrc: Oral   Oral  SpO2:  96% 96%   Weight:      Height:        Intake/Output Summary (Last 24 hours) at 11/22/2017 0939 Last data filed at 11/22/2017 0700 Gross per 24 hour  Intake 5469.58 ml  Output 150 ml  Net 5319.58 ml   Filed Weights   11/20/17 2315  Weight: 45.1 kg (99 lb 6.8 oz)    Examination: General exam: NAD, jaundice  HEENT: Scleral icterus, conjunctive are pale Respiratory system: Clear to auscultation. No wheezes,crackle or rhonchi Cardiovascular system: S1 & S2 heard, RRR. No JVD, murmurs, rubs or gallops Gastrointestinal system: Abdomen is nondistended, soft and nontender.  Central nervous system: Alert and oriented.  No focal neurological deficits. Extremities: No pedal edema.  Skin: No rashes. Psychiatry: Mood & affect appropriate.    Data Reviewed: I have personally reviewed following labs and imaging studies  CBC: Recent Labs  Lab 11/20/17 1005 11/21/17 0337 11/21/17 1013 11/21/17 2054 11/22/17 0244  WBC 12.3* 7.4 12.0*  --  11.1*  NEUTROABS  --   --   --   --  10.0*  HGB  9.0* 8.0* 7.0* 9.9* 10.8*  HCT 26.6* 24.1* 20.8* 29.0* 31.5*  MCV 104.7* 100.4* 100.0  --  92.4  PLT 313 196 229  --  782   Basic Metabolic Panel: Recent Labs  Lab 11/20/17 1005 11/21/17 0337 11/22/17 0244  NA 133* 135 139  K 3.7 4.4 4.5  CL 99* 105 113*  CO2 22 19* 19*  GLUCOSE 110* 91 171*  BUN 37* 34* 45*  CREATININE 0.68 0.68 0.85  CALCIUM 9.4 8.7* 8.2*   GFR: Estimated Creatinine Clearance: 40.8 mL/min (by C-G formula based on SCr of 0.85 mg/dL). Liver Function Tests: Recent Labs  Lab 11/20/17 1005  AST 135*  ALT 133*  ALKPHOS 173*  BILITOT 16.7*  PROT 7.5  ALBUMIN 3.4*   No results for input(s): LIPASE, AMYLASE in the last 168 hours. No results for input(s): AMMONIA in the last 168 hours. Coagulation Profile: Recent Labs  Lab 11/20/17 1005  INR 1.25   Cardiac Enzymes: No results for input(s): CKTOTAL, CKMB, CKMBINDEX, TROPONINI in the last 168 hours. BNP (last 3 results) No results for input(s): PROBNP in the last 8760 hours. HbA1C: No results for input(s): HGBA1C in the last 72 hours. CBG: No results for input(s): GLUCAP in the last 168 hours. Lipid Profile: No results for input(s): CHOL, HDL, LDLCALC, TRIG, CHOLHDL, LDLDIRECT in the last 72 hours. Thyroid Function Tests: No results for input(s): TSH, T4TOTAL, FREET4, T3FREE, THYROIDAB in the last 72 hours. Anemia Panel: No results for input(s): VITAMINB12, FOLATE, FERRITIN, TIBC, IRON, RETICCTPCT in the last 72 hours. Sepsis Labs: No results for input(s): PROCALCITON, LATICACIDVEN in the last 168 hours.  Recent Results (from the past 240 hour(s))  MRSA PCR Screening     Status: None   Collection Time: 11/20/17 11:26 PM  Result Value Ref Range Status   MRSA by PCR NEGATIVE NEGATIVE Final    Comment:        The GeneXpert MRSA Assay (FDA approved for NASAL specimens only), is one component of a comprehensive MRSA colonization surveillance program. It is not intended to diagnose MRSA infection  nor to guide or monitor treatment for MRSA infections. Performed at [facility], [place] [address], [place], [place] [address]       Radiology Studies: No results found.    Scheduled Meds: Continuous Infusions: . sodium chloride    . sodium chloride    . sodium chloride    . dextrose 5% lactated ringers 75 mL/hr at 11/22/17 0600  . octreotide  (SANDOSTATIN)    IV infusion 50 mcg/hr (11/22/17 0600)  . pantoprozole (PROTONIX) infusion 8 mg/hr (11/22/17 0600)     LOS: 2 days    Time spent: Total of 35 minutes spent with pt, greater than 50% of which was spent in discussion of  treatment, counseling and coordination of care    Chipper Oman, MD Pager: Text Page via [email or web address]   If 7PM-7AM, please contact night-coverage [email or web address] 11/22/2017, 9:39 AM   Note - This record has been created using [employer]. Chart creation errors have been sought,  but may not always have been located. Such creation errors do not reflect on the standard of medical care.

## 2017-11-22 NOTE — Progress Notes (Signed)
  Oncology Nurse Navigator Documentation  Navigator Location: [facility] (11/22/17 0855) In-patient Rm 1222   )                      Patient Visit Type: Inpatient (11/22/17 0855)   Barriers/Navigation Needs: Education (11/22/17 3810)    Interventions: Psycho-social support (11/22/17 0855) Met with patient this AM to offer support and to answer questions about what was discussed with Dr. Burr Medico. I explained what palliative care means and how Dr. Burr Medico would still be involved in her care. Patient processed the way that she sees her life is ending. Patient is looking forward to her step-daughter arriving today.Encouargement and support offered.            Acuity: Level 2 (11/22/17 0855)   Acuity Level 2: Educational needs;Ongoing guidance and education throughout treatment as needed (11/22/17 0855)     Time Spent with Patient: 30 (11/22/17 0855)

## 2017-11-22 NOTE — Progress Notes (Signed)
Pt on protonix gtts, octreotide gtts and D5LR gtts. Alert and oriented. Pt vomitted bright red blood and 1 maroon BM overnight. Hospitalist notified. Ordered CBC for morning. Hemodynamically stable. NPO, educated pt on need for NPO. Will continue to monitor.

## 2017-11-23 ENCOUNTER — Other Ambulatory Visit: Payer: Self-pay | Admitting: *Deleted

## 2017-11-23 DIAGNOSIS — K922 Gastrointestinal hemorrhage, unspecified: Secondary | ICD-10-CM

## 2017-11-23 DIAGNOSIS — K920 Hematemesis: Secondary | ICD-10-CM

## 2017-11-23 MED ORDER — SODIUM CHLORIDE 0.9 % IV BOLUS (SEPSIS)
500.0000 mL | Freq: Once | INTRAVENOUS | Status: AC
  Administered 2017-11-23: 500 mL via INTRAVENOUS

## 2017-11-23 MED ORDER — ONDANSETRON HCL 4 MG/2ML IJ SOLN: 4.0000 mg | INTRAMUSCULAR | 0 refills | Status: AC | PRN

## 2017-11-23 MED ORDER — PROMETHAZINE HCL 25 MG/ML IJ SOLN: 6.2500 mg | Freq: Four times a day (QID) | INTRAMUSCULAR | 0 refills | Status: AC | PRN

## 2017-11-23 MED ORDER — MORPHINE SULFATE (PF) 4 MG/ML IV SOLN: 2.0000 mg | INTRAVENOUS | 0 refills | Status: AC | PRN

## 2017-11-23 MED ORDER — MORPHINE SULFATE (PF) 4 MG/ML IV SOLN
2.0000 mg | INTRAVENOUS | Status: DC | PRN
  Administered 2017-11-23 (×2): 2 mg via INTRAVENOUS
  Filled 2017-11-23: qty 1

## 2017-11-23 MED ORDER — LORAZEPAM 2 MG/ML IJ SOLN: 0.5000 mg | INTRAMUSCULAR | 0 refills | Status: AC | PRN

## 2017-11-23 NOTE — Patient Outreach (Signed)
[facility]) Care Management  11/23/2017  Elizabeth Kidd 15-Jun-1946 [id number]  [name] is being transferred to a hospice facility today for comfort care. [facility] Care Management is grateful for the opportunity to have been involved in [name]'s care if only briefly.   Plan: We will close her community case management case as [name] will have access to full and comprehensive case management services at the [facility].    [facility]  [phone]

## 2017-11-23 NOTE — Progress Notes (Signed)
[facility]  Met with pt's step daughter and Chauncey Reading, and a grand daughter. Discussed the hospice philosophy and hospice home service agreement. Discussed pt's case with Dr. Ander Purpura our MD and she does approve pt for hospice care at [facility]. The pt has accepted the bed offer and request that Joelene Millin her step daughter to sign consents. Elmyra Ricks aware of the pt status and we will be setting up Ambulance for pt to transfer to [facility].  Webb Silversmith [facility]  [phone]

## 2017-11-23 NOTE — Progress Notes (Signed)
PROGRESS NOTE [facility]   Channell Quattrone   NFA:[id number] DOB: 1946/04/23  DOA: 11/20/2017 PCP: Burnard Bunting, MD   Brief Narrative:  Elizabeth Kidd is a 72 year old female with medical history of pancreatic head cancer, involving celiac trunk and SMA complicated by obstructive jaundice.  Presented to the emergency department with hematemesis.  Patient was undergoing IR internalization of her biliary drain when she suddenly developed hematemesis, she also report melanotic stool to 3 days prior to admission.  On initial evaluation patient was found to be anemic with hemoglobin of 9, total bilirubin of 16.7, vital signs stable.  Patient was admitted with working diagnosis of acute blood loss anemia due to upper GI bleed with complicated jaundice.  GI was consulted, patient underwent EGD but unable to find or resolve the source of bleeding.  IR was consulted, patient is not a candidate for TIPS and patient was unstable for endovascular procedure.  Patient is status post 3 units of PRBCs.  Oncology following.  Palliative care has been consulted and patient with family decided to go for comfort measures.    Subjective: Patient seen and examined, still vomiting and with diarrhea. Denies abdominal pain. Overnight BP low.   Assessment & Plan: Acute blood loss anemia due to GI bleed Felt to be secondary to pancreatic cancer invasion of the duodenum GI unable to resolve bleeding endoscopically. Oncology discussed with IR, patient is not a candidate for vascular procedure or TIPS Hemoglobin stable continues to be stable after 3 units of PRBCs. Patient now on comfort measures only, will d/c Protonix and octreotide  Continue morphine and ativan for comfort support   Pancreatic head cancer with obstructive jaundice Patient has a sternal biliary drain in place goal, total bili elevated Comfort care   Goal of care Poor prognosis, patient on comfort care. Will transition to residential hospice  when bed available   DVT prophylaxis: SCDs Code Status: DNR Family Communication: Family at bedside Disposition Plan: Residential Hospice when bed available   Consultants:   GI  Oncology  IR  Procedures:   EGD  Antimicrobials:  None   Objective: Vitals:   11/23/17 0400 11/23/17 0500 11/23/17 0600 11/23/17 0800  BP: (!) 81/42 (!) 82/45 (!) 94/45   Pulse: (!) 125 (!) 125 (!) 119   Resp: 13 16 11    Temp:    97.7 F (36.5 C)  TempSrc:    Oral  SpO2: 100% 100% 100%   Weight:      Height:        Intake/Output Summary (Last 24 hours) at 11/23/2017 0832 Last data filed at 11/23/2017 0600 Gross per 24 hour  Intake 4074.17 ml  Output 280 ml  Net 3794.17 ml   Filed Weights   11/20/17 2315  Weight: 45.1 kg (99 lb 6.8 oz)    Examination:  General: Pt is alert, awake, not in acute distress, jaundice  HEENT: Scleral icterus, no LAD  Cardiovascular: RRR, S1/S2 +, no rubs, no gallops Respiratory: CTA bilaterally, no wheezing, no rhonchi Abdominal: Soft, NT, ND, bowel sounds + Extremities: no edema  Data Reviewed: I have personally reviewed following labs and imaging studies  CBC: Recent Labs  Lab 11/20/17 1005 11/21/17 0337 11/21/17 1013 11/21/17 2054 11/22/17 0244 11/22/17 0955  WBC 12.3* 7.4 12.0*  --  11.1* 10.3  NEUTROABS  --   --   --   --  10.0*  --   HGB 9.0* 8.0* 7.0* 9.9* 10.8* 9.4*  HCT 26.6* 24.1* 20.8* 29.0* 31.5* 26.7*  MCV 104.7* 100.4* 100.0  --  92.4 92.1  PLT 313 196 229  --  245 254   Basic Metabolic Panel: Recent Labs  Lab 11/20/17 1005 11/21/17 0337 11/22/17 0244  NA 133* 135 139  K 3.7 4.4 4.5  CL 99* 105 113*  CO2 22 19* 19*  GLUCOSE 110* 91 171*  BUN 37* 34* 45*  CREATININE 0.68 0.68 0.85  CALCIUM 9.4 8.7* 8.2*   GFR: Estimated Creatinine Clearance: 40.8 mL/min (by C-G formula based on SCr of 0.85 mg/dL). Liver Function Tests: Recent Labs  Lab 11/20/17 1005  AST 135*  ALT 133*  ALKPHOS 173*  BILITOT 16.7*  PROT  7.5  ALBUMIN 3.4*   No results for input(s): LIPASE, AMYLASE in the last 168 hours. No results for input(s): AMMONIA in the last 168 hours. Coagulation Profile: Recent Labs  Lab 11/20/17 1005  INR 1.25   Cardiac Enzymes: No results for input(s): CKTOTAL, CKMB, CKMBINDEX, TROPONINI in the last 168 hours. BNP (last 3 results) No results for input(s): PROBNP in the last 8760 hours. HbA1C: No results for input(s): HGBA1C in the last 72 hours. CBG: No results for input(s): GLUCAP in the last 168 hours. Lipid Profile: No results for input(s): CHOL, HDL, LDLCALC, TRIG, CHOLHDL, LDLDIRECT in the last 72 hours. Thyroid Function Tests: No results for input(s): TSH, T4TOTAL, FREET4, T3FREE, THYROIDAB in the last 72 hours. Anemia Panel: No results for input(s): VITAMINB12, FOLATE, FERRITIN, TIBC, IRON, RETICCTPCT in the last 72 hours. Sepsis Labs: No results for input(s): PROCALCITON, LATICACIDVEN in the last 168 hours.  Recent Results (from the past 240 hour(s))  MRSA PCR Screening     Status: None   Collection Time: 11/20/17 11:26 PM  Result Value Ref Range Status   MRSA by PCR NEGATIVE NEGATIVE Final    Comment:        The GeneXpert MRSA Assay (FDA approved for NASAL specimens only), is one component of a comprehensive MRSA colonization surveillance program. It is not intended to diagnose MRSA infection nor to guide or monitor treatment for MRSA infections. Performed at [facility], [place] [address], [place], [place] [address]       Radiology Studies: No results found.    Scheduled Meds: . feeding supplement  1 Container Oral TID BM  . feeding supplement (PRO-STAT SUGAR FREE 64)  30 mL Oral BID  .  morphine injection  2 mg Intravenous Q6H   Continuous Infusions: . sodium chloride    . sodium chloride    . sodium chloride    . dextrose 5% lactated ringers 75 mL/hr at 11/23/17 0600     LOS: 3 days    Time spent: Total of 25 minutes spent with  pt, greater than 50% of which was spent in discussion of  treatment, counseling and coordination of care   Chipper Oman, MD Pager: Text Page via [email or web address]   If 7PM-7AM, please contact night-coverage [email or web address] 11/23/2017, 8:32 AM   Note - This record has been created using [employer]. Chart creation errors have been sought, but may not always have been located. Such creation errors do not reflect on the standard of medical care.

## 2017-11-23 NOTE — Discharge Summary (Signed)
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  Presented to the emergency department with hematemesis.  Patient was undergoing IR internalization of her biliary drain when she suddenly developed hematemesis, she also report melanotic stool to 3 days prior to admission.  On initial evaluation patient was found to be anemic with hemoglobin of 9, total bilirubin of 16.7, vital signs stable.  Patient was admitted with working diagnosis of acute blood loss anemia due to upper GI bleed with complicated jaundice.  GI was consulted, patient underwent EGD but unable to find or resolve the source of bleeding.  IR was consulted, patient is not a candidate for TIPS and patient was unstable for endovascular procedure.  Patient is status post 3 units of PRBCs.  Oncology following.  Palliative care has been consulted and patient with family decided to go for comfort measures.   Discharge Diagnoses/Hospital Course:  Acute blood loss anemia due to GI bleed Felt to be secondary to pancreatic cancer invasion of the duodenum GI unable to resolve bleeding endoscopically. Oncology discussed with IR, patient is not a candidate for vascular procedures or TIPS Hemoglobin stable continues to be stable after 3 units of PRBCs. Patient going to hospice facility for comfort care  Continue morphine and ativan for comfort support   Pancreatic head cancer with obstructive jaundice and suspected mets  Patient has a  sternal biliary drain in place goal, total bili elevated Comfort care - terminal condition   Discharge Instructions  You were cared for by a hospitalist during your hospital stay. If you have any questions about your discharge medications or the care you received while you were in the hospital after you are discharged, you can call the unit and asked to speak with the hospitalist on call if the hospitalist that took care of you is not available. Once you are discharged, your primary care physician will handle any further medical issues. Please note that NO REFILLS for any discharge medications will be authorized once you are discharged, as it is imperative that you return to your primary care physician (or establish a relationship with a primary care physician if you do not have one) for your aftercare needs so that they can reassess your need for medications and monitor your lab values.  Discharge Instructions    Call MD for:  difficulty breathing, headache or visual disturbances   Complete by:  As directed    Call MD for:  extreme fatigue   Complete by:  As directed    Call MD for:  hives   Complete by:  As directed    Call MD for:  persistant dizziness or light-headedness   Complete by:  As directed    Call MD for:  persistant nausea and vomiting   Complete by:  As directed    Call MD for:  redness, tenderness, or signs of infection (pain, swelling, redness, odor or green/yellow discharge around incision site)   Complete by:  As directed    Call MD for:  severe uncontrolled pain  Complete by:  As directed    Call MD for:  temperature >100.4   Complete by:  As directed    Diet - low sodium heart healthy   Complete by:  As directed    Increase activity slowly   Complete by:  As directed      Allergies as of 11/23/2017      Reactions   Anesthesia S-i-40 [propofol] Nausea Only      Medication List    STOP taking these medications   ibuprofen 200 MG tablet Commonly known as:   ADVIL,MOTRIN   ondansetron 4 MG tablet Commonly known as:  ZOFRAN Replaced by:  ondansetron 4 MG/2ML Soln injection   prochlorperazine 10 MG tablet Commonly known as:  COMPAZINE   SYSTANE OP   traMADol 50 MG tablet Commonly known as:  ULTRAM     TAKE these medications   LORazepam 2 MG/ML injection Commonly known as:  ATIVAN Inject 0.25-0.5 mLs (0.5-1 mg total) into the vein every 4 (four) hours as needed for anxiety.   morphine 4 MG/ML injection Inject 0.5-1 mLs (2-4 mg total) into the vein every hour as needed for severe pain (SOB).   ondansetron 4 MG/2ML Soln injection Commonly known as:  ZOFRAN Inject 2 mLs (4 mg total) into the vein every 4 (four) hours as needed for nausea. Replaces:  ondansetron 4 MG tablet   promethazine 25 MG/ML injection Commonly known as:  PHENERGAN Inject 0.25 mLs (6.25 mg total) into the vein every 6 (six) hours as needed for nausea or vomiting (refractory to Zofran).       Allergies  Allergen Reactions  . Anesthesia S-I-40 [Propofol] Nausea Only    Consultations: Palliative care  GI  Interventional Radiology  Oncology   Procedures/Studies: Ct Abdomen Pelvis W Contrast  Result Date: 11/03/2017 CLINICAL DATA:  Abdominal pain, weight loss and jaundice. EXAM: CT ABDOMEN AND PELVIS WITH CONTRAST TECHNIQUE: Multidetector CT imaging of the abdomen and pelvis was performed using the standard protocol following bolus administration of intravenous contrast. CONTRAST:  100 cc of Isovue-300 COMPARISON:  None. FINDINGS: Lower chest: No pleural effusion. Numerous pulmonary nodules are identified throughout both lungs. Index nodule in the right lower lobe measures 1 cm, image 4 of series 3. Index nodule in the left lower lobe measures 7 mm, image 9 of series 3. Hepatobiliary: Moderate to marked intrahepatic bile duct dilatation. No focal liver abnormality identified to suggest metastatic disease. The proximal common bile duct measures 1.4 cm, image 25  of series 2. There is an abrupt cut off at the level of the mid common bile duct by pancreas mass. Pancreas: There is a large mass centered around the expected location of the head of pancreas which measures 3.8 by 2.6 by 4.1 cm. There is surrounding soft tissue infiltration which encases and nearly completely occludes the portal vein and portal venous confluence. Tumor extends into the aortocaval region and encases both the celiac and superior mesenteric arteries. Atrophy of the body and tail of pancreas noted. Spleen: The spleen is measures 6.9 x 7.8 x 13.3 cm (volume = 370 cm^3) Adrenals/Urinary Tract: Normal appearance of the adrenal glands. No kidney mass or hydronephrosis. Small left lower pole cyst measures 8 mm. Urinary bladder appears normal. Stomach/Bowel: The stomach appears normal. Tumor has mass effect upon the medial wall of the descending duodenum. Cannot rule out invasion. No gastric outlet obstruction. No abnormal small bowel dilatation identified. The appendix is visualized and is normal. Normal appearance of the colon.  Vascular/Lymphatic: Mild aortic atherosclerosis. Tumor encasement of the celiac and superior mesenteric arteries noted. There is also encasement and significant narrowing of the portal venous confluence and extrahepatic portal vein. Dilated intrahepatic portal vein is non thrombosed. Splenic vein remains patent. Small distal esophageal varices and multiple upper abdominal collateral vessels identified. Soft tissue infiltration within the proximal aortocaval region measures 1.4 by 2.9 cm. No pelvic or inguinal adenopathy. Reproductive: The uterus appears atrophic. No adnexal mass identified. Other: No ascites.  No peritoneal nodularity identified. Musculoskeletal: Spondylosis identified within the lumbar spine. There is an anterolisthesis of L4 on L5. No suspicious bone lesions. IMPRESSION: 1. Large, aggressive appearing mass involving the head of pancreas is identified with  infiltration into the surrounding soft tissues. There is evidence of vascular involvement the portal venous confluence, extrahepatic portal vein, celiac trunk and superior mesenteric artery. Mass completely occludes the common bile duct resulting in marked intrahepatic biliary dilatation. There is also mass effect upon the medial wall of the descending duodenum. Cannot rule out invasion of the duodenum. 2. Numerous bilateral lower lobe pulmonary nodules compatible with metastatic disease. Electronically Signed   By: Kerby Moors M.D.   On: 11/03/2017 10:46   Dg Ercp Biliary & Pancreatic Ducts  Result Date: 11/09/2017 CLINICAL DATA:  Pancreatic mass.  Attempted ERCP. EXAM: ERCP TECHNIQUE: Multiple spot images obtained with the fluoroscopic device and submitted for interpretation post-procedure. COMPARISON:  CT abdomen and pelvis - 11/03/2017 FLUOROSCOPY TIME:  13 minutes, 43 seconds FINDINGS: 3 spot intraoperative fluoroscopic images of the right upper abdominal quadrant during ERCP are provided for review. Initial image demonstrates an ERCP probe overlying the right upper abdominal quadrant. There is passage of a wire through the CBD and into the origin of the cystic duct with faint contrast opacification. Subsequent images demonstrate faint opacification the distal aspect of the CBD as well as the pancreatic duct. There is no significant opacification intrahepatic biliary system. No are discrete filling defects within the opacified portions of the biliary system. IMPRESSION: ERCP as above. These images were submitted for radiologic interpretation only. Please see the procedural report for the amount of contrast and the fluoroscopy time utilized. Electronically Signed   By: Sandi Mariscal M.D.   On: 11/09/2017 14:34   Ir Biliary Drain Placement With Cholangiogram  Result Date: 11/10/2017 INDICATION: Pancreas mass, biliary obstruction, jaundice EXAM: ULTRASOUND AND FLUOROSCOPIC RIGHT EXTERNAL ONLY 10 FRENCH  BILIARY DRAIN MEDICATIONS: 3.375 G ZOSYN; The antibiotic was administered within an appropriate time frame prior to the initiation of the procedure. ANESTHESIA/SEDATION: Moderate (conscious) sedation was employed during this procedure. A total of Versed 3 mg and Fentanyl 150 mcg was administered intravenously. Moderate Sedation Time: 47 minutes. The patient's level of consciousness and vital signs were monitored continuously by radiology nursing throughout the procedure under my direct supervision. FLUOROSCOPY TIME:  Fluoroscopy Time: 11 minutes 54 seconds (101 mGy). COMPLICATIONS: None immediate. PROCEDURE: Informed written consent was obtained from the patient after a thorough discussion of the procedural risks, benefits and alternatives. All questions were addressed. Maximal Sterile Barrier Technique was utilized including caps, mask, sterile gowns, sterile gloves, sterile drape, hand hygiene and skin antiseptic. A timeout was performed prior to the initiation of the procedure. Preliminary ultrasound performed. Dilated right hepatic ducts demonstrated. Lower intercostal space marked. Under sterile conditions and local anesthesia, a 21 gauge access needle was advanced percutaneously to a peripheral right hepatic duct. Contrast injection performed for cholangiogram. Diffuse biliary dilatation noted. Distal CBD obstruction evident. Eventually a 018  guidewire advanced into the biliary tree. Accustick dilator set advanced. Amplatz guidewire exchange performed. Kumpe catheter and Glidewire were utilized to attempt manipulation past the biliary obstruction however this was unsuccessful related to the marked dilatation of the CBD. Amplatz guidewire placed to insert a Keystone catheter. Retention loop formed in the obstructed dilated common bile duct. Ducts were decompressed by syringe aspiration. Catheter secured with a Prolene suture and connected to external gravity drainage bag. Sterile dressing  applied. No immediate complication. Patient tolerated the procedure well. IMPRESSION: Successful ultrasound and fluoroscopic 10 French right external biliary drain insertion. Bile culture sent. Electronically Signed   By: Jerilynn Mages.  Shick M.D.   On: 11/10/2017 15:42     Discharge Exam: Vitals:   11/23/17 1200 11/23/17 1300  BP: (!) 104/54 (!) 108/48  Pulse: (!) 115 (!) 111  Resp: 12 12  Temp: 97.7 F (36.5 C)   SpO2: 100% 100%   Vitals:   11/23/17 1000 11/23/17 1100 11/23/17 1200 11/23/17 1300  BP: (!) 78/40 (!) 107/53 (!) 104/54 (!) 108/48  Pulse:  (!) 115 (!) 115 (!) 111  Resp: 17 17 12 12   Temp:   97.7 F (36.5 C)   TempSrc:   Oral   SpO2:  100% 100% 100%  Weight:      Height:        General: Pt is alert, awake, not in acute distress   The results of significant diagnostics from this hospitalization (including imaging, microbiology, ancillary and laboratory) are listed below for reference.     Microbiology: Recent Results (from the past 240 hour(s))  MRSA PCR Screening     Status: None   Collection Time: 11/20/17 11:26 PM  Result Value Ref Range Status   MRSA by PCR NEGATIVE NEGATIVE Final    Comment:        The GeneXpert MRSA Assay (FDA approved for NASAL specimens only), is one component of a comprehensive MRSA colonization surveillance program. It is not intended to diagnose MRSA infection nor to guide or monitor treatment for MRSA infections. Performed at [facility], [place] [address] [place], [place] [address]      Labs: BNP (last 3 results) No results for input(s): BNP in the last 8760 hours. Basic Metabolic Panel: Recent Labs  Lab 11/20/17 1005 11/21/17 0337 11/22/17 0244  NA 133* 135 139  K 3.7 4.4 4.5  CL 99* 105 113*  CO2 22 19* 19*  GLUCOSE 110* 91 171*  BUN 37* 34* 45*  CREATININE 0.68 0.68 0.85  CALCIUM 9.4 8.7* 8.2*   Liver Function Tests: Recent Labs  Lab 11/20/17 1005  AST 135*  ALT 133*  ALKPHOS 173*   BILITOT 16.7*  PROT 7.5  ALBUMIN 3.4*   No results for input(s): LIPASE, AMYLASE in the last 168 hours. No results for input(s): AMMONIA in the last 168 hours. CBC: Recent Labs  Lab 11/20/17 1005 11/21/17 0337 11/21/17 1013 11/21/17 2054 11/22/17 0244 11/22/17 0955  WBC 12.3* 7.4 12.0*  --  11.1* 10.3  NEUTROABS  --   --   --   --  10.0*  --   HGB 9.0* 8.0* 7.0* 9.9* 10.8* 9.4*  HCT 26.6* 24.1* 20.8* 29.0* 31.5* 26.7*  MCV 104.7* 100.4* 100.0  --  92.4 92.1  PLT 313 196 229  --  245 328   Cardiac Enzymes: No results for input(s): CKTOTAL, CKMB, CKMBINDEX, TROPONINI in the last 168 hours. BNP: Invalid input(s): POCBNP CBG: No results for  input(s): GLUCAP in the last 168 hours. D-Dimer No results for input(s): DDIMER in the last 72 hours. Hgb A1c No results for input(s): HGBA1C in the last 72 hours. Lipid Profile No results for input(s): CHOL, HDL, LDLCALC, TRIG, CHOLHDL, LDLDIRECT in the last 72 hours. Thyroid function studies No results for input(s): TSH, T4TOTAL, T3FREE, THYROIDAB in the last 72 hours.  Invalid input(s): FREET3 Anemia work up No results for input(s): VITAMINB12, FOLATE, FERRITIN, TIBC, IRON, RETICCTPCT in the last 72 hours. Urinalysis    Component Value Date/Time   COLORURINE YELLOW 03/19/2011 2135   APPEARANCEUR CLEAR 03/19/2011 2135   LABSPEC 1.006 03/19/2011 2135   PHURINE 7.0 03/19/2011 2135   GLUCOSEU NEGATIVE 03/19/2011 2135   HGBUR NEGATIVE 03/19/2011 2135   BILIRUBINUR NEGATIVE 03/19/2011 2135   KETONESUR NEGATIVE 03/19/2011 2135   PROTEINUR NEGATIVE 03/19/2011 2135   UROBILINOGEN 0.2 03/19/2011 2135   NITRITE NEGATIVE 03/19/2011 2135   LEUKOCYTESUR NEGATIVE 03/19/2011 2135   Sepsis Labs Invalid input(s): PROCALCITONIN,  WBC,  LACTICIDVEN Microbiology Recent Results (from the past 240 hour(s))  MRSA PCR Screening     Status: None   Collection Time: 11/20/17 11:26 PM  Result Value Ref Range Status   MRSA by PCR NEGATIVE  NEGATIVE Final    Comment:        The GeneXpert MRSA Assay (FDA approved for NASAL specimens only), is one component of a comprehensive MRSA colonization surveillance program. It is not intended to diagnose MRSA infection nor to guide or monitor treatment for MRSA infections. Performed at [facility], [place] [address], [place], [place] [address]    Time coordinating discharge: 30 minutes  SIGNED:  Chipper Oman, MD  [facility] 11/23/2017, 2:30 PM  Pager please text page via  [email or web address]  Note - This record has been created using [employer]. Chart creation errors have been sought, but may not always have been located. Such creation errors do not reflect on the standard of medical care.

## 2017-11-23 NOTE — Progress Notes (Addendum)
DNR signed.  Physician informed to complete DC summary 2:40-[facility] called to transport.   Kathrin Greathouse, Latanya Presser, MSW Clinical Social Worker  [phone] 11/23/2017  2:04 PM

## 2017-11-23 NOTE — Progress Notes (Signed)
Palliative:  I met again with Elizabeth Kidd and family at bedside. Working towards hospice facility today.  Discussed symptom management and comfort - offered infusion but declined at this time. She is declining quickly and appears extremely fatigued and weakened at this time but still enjoying wakeful moments with her family. All questions/concerns addressed. Emotional support provided.  20 min  Vinie Sill, NP Palliative Medicine Team Pager # [phone] (M-F 8a-5p) Team Phone # [phone] (Nights/Weekends)

## 2017-11-23 NOTE — Clinical Social Work Note (Signed)
Clinical Social Work Assessment  Patient Details  Name: Elizabeth Kidd MRN: [id number] Date of Birth: Dec 23, 1945  Date of referral:  11/23/17               Reason for consult:  End of Life/Hospice                Permission sought to share information with:  Family Supports, Customer service manager, Case Optician, dispensing granted to share information::  Yes, Verbal Permission Granted  Name::        Agency::   [facility]   Relationship::  Daughter   Contact Information:     Housing/Transportation Living arrangements for the past 2 months:  Single Family Home Source of Information:  Patient Patient Interpreter Needed:  None Criminal Activity/Legal Involvement Pertinent to Current Situation/Hospitalization:  No - Comment as needed Significant Relationships:  Adult Children, Friend, [employer] Lives with:  Self Do you feel safe going back to the place where you live?  No Need for family participation in patient care:  Yes (Dependent with mobility)   Care giving concerns:   End of life/ Residential Hospice  Social Worker assessment / plan:  CSW met with the patient, daughter and granddaughter at bedside. Patient alert and oriented. CSW discussed discharge plan to residential hospice in [place]. Patient reports she lives in [place] and wants to be close to her friends and family. Patient gave CSW permission to make referral to [place]. Hospice Liaison will follow up with the patient and her family today.   Plan: Residential Hospice  Employment status:  Retired Nurse, adult PT Recommendations:  Not assessed at this time Information / Referral to community resources:     Patient/Family's Response to care: Agreeable and Responding to care.   Patient/Family's Understanding of and Emotional Response to Diagnosis, Current Treatment, and Prognosis: Patient knowledgeable of her diagnosis and prognosis. Patient adult  children came from [place] to assist with her transition to Hospice.   Emotional Assessment Appearance:  Appears stated age Attitude/Demeanor/Rapport:    Affect (typically observed):  Accepting Orientation:  Oriented to Self, Oriented to Place, Oriented to  Time, Oriented to Situation Alcohol / Substance use:  Not Applicable Psych involvement (Current and /or in the community):  No (Comment)  Discharge Needs  Concerns to be addressed:  Discharge Planning Concerns Readmission within the last 30 days:  No Current discharge risk:  Terminally ill Barriers to Discharge:  Continued Medical Work up   [employer], LCSW 11/23/2017, 8:24 AM

## 2017-11-23 NOTE — Progress Notes (Signed)
Report given to Neoma Laming at hospice care of [facility]. Will continue to monitor until time of d/c.

## 2017-11-23 NOTE — Progress Notes (Signed)
Attempted to call report to Hospice care of [facility] but unable to do at this time as all nurses were tied up with patient care.  Will attempt to call report again shortly.

## 2017-11-24 ENCOUNTER — Ambulatory Visit: Payer: Medicare Other

## 2017-11-24 ENCOUNTER — Ambulatory Visit: Payer: Medicare Other | Admitting: Nurse Practitioner

## 2017-11-24 ENCOUNTER — Other Ambulatory Visit: Payer: Medicare Other

## 2017-11-27 ENCOUNTER — Ambulatory Visit: Payer: Medicare Other | Admitting: Nurse Practitioner

## 2017-12-18 DEATH — deceased

## 2018-01-10 ENCOUNTER — Other Ambulatory Visit: Payer: Self-pay | Admitting: Hematology

## 2019-08-10 IMAGING — CT CT ABD-PELV W/ CM
2 of 5 series · 15 of 46 positions shown, 17 images · IV contrast (ISOVUE 300)
Comparison: None.

CLINICAL DATA: Abdominal pain, weight loss and jaundice.

EXAM:
CT ABDOMEN AND PELVIS WITH CONTRAST
TECHNIQUE: Multidetector CT imaging of the abdomen and pelvis was performed
using the standard protocol following bolus administration of
intravenous contrast.
CONTRAST:  100 cc of Jsovue-RZZ

[Series 2: abd/pel w · axial · 0.67mm/px · z∈[+1044,+1389]mm · 12 of 79 slices shown, 14 images]
[im 5/79  soft-tissue]
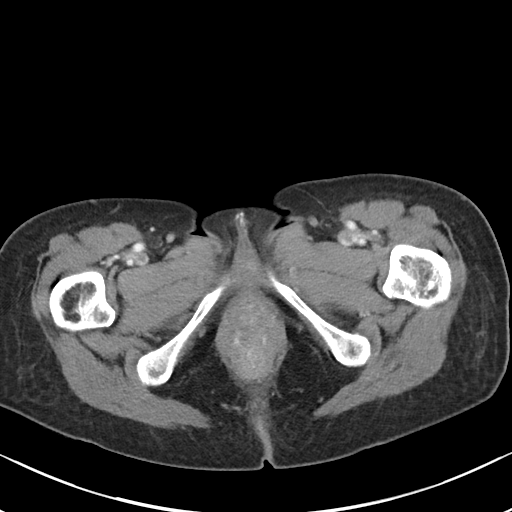
[im 5/79  bone]
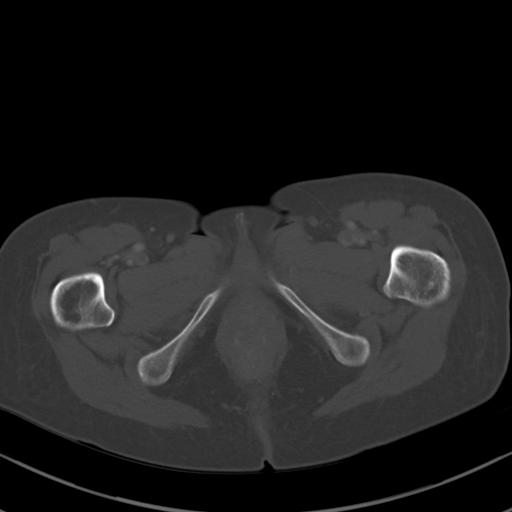
[im 14/79  soft-tissue]
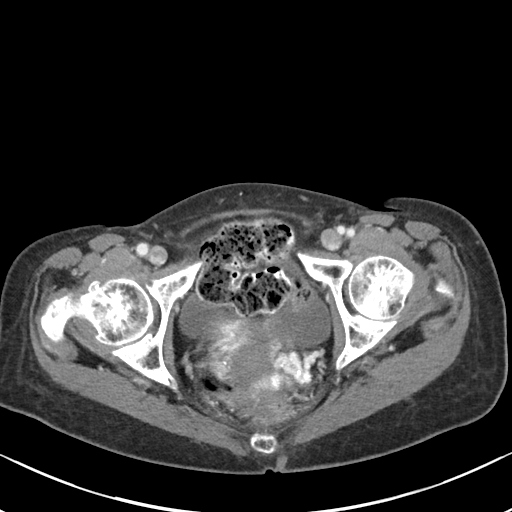
[im 18/79  soft-tissue]
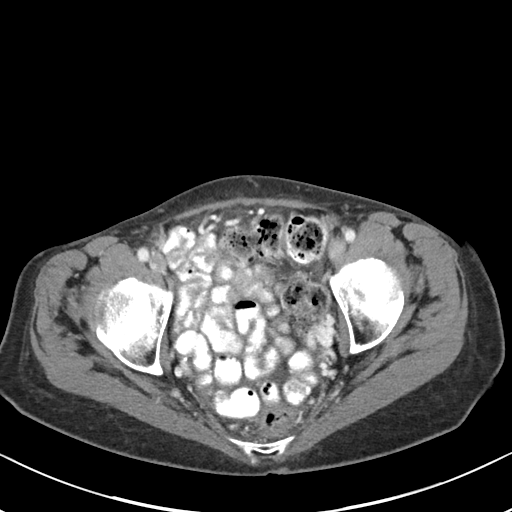
[im 22/79  soft-tissue]
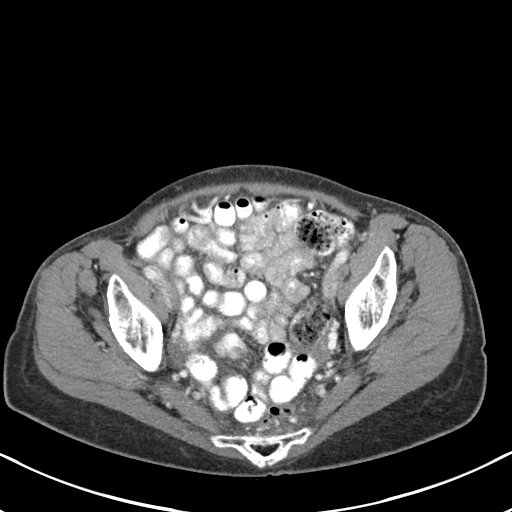
[im 31/79  soft-tissue]
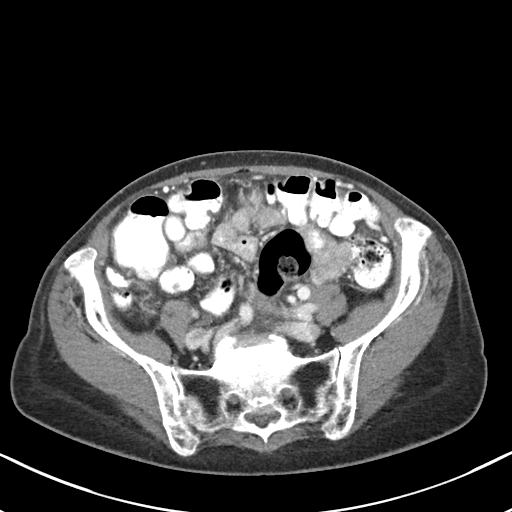
[im 35/79  soft-tissue]
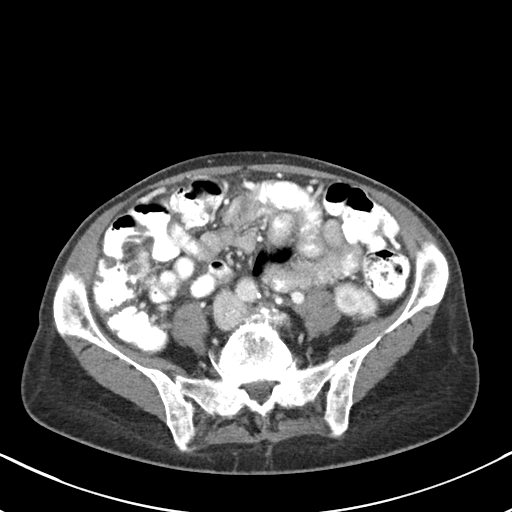
[im 44/79  soft-tissue]
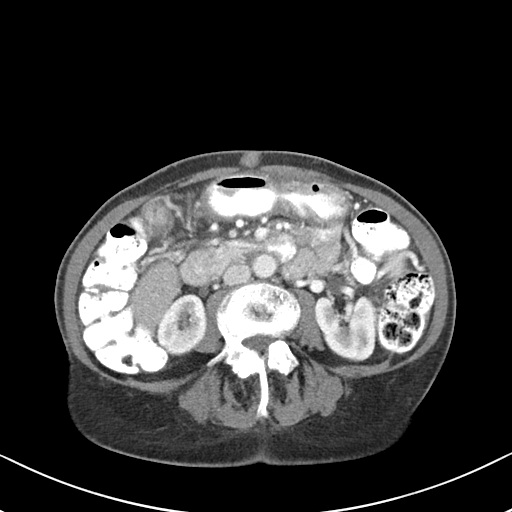
[im 48/79  soft-tissue]
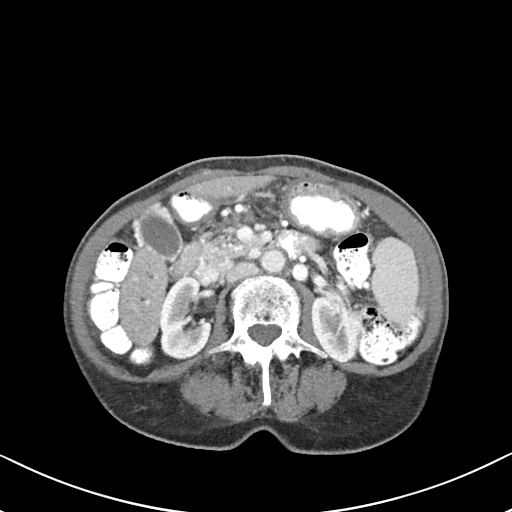
[im 57/79  soft-tissue]
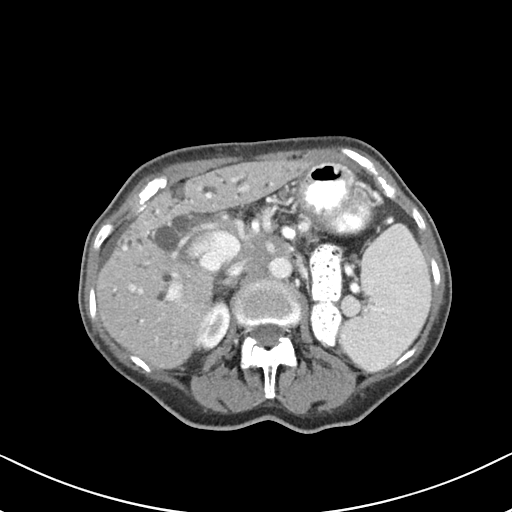
[im 57/79  bone]
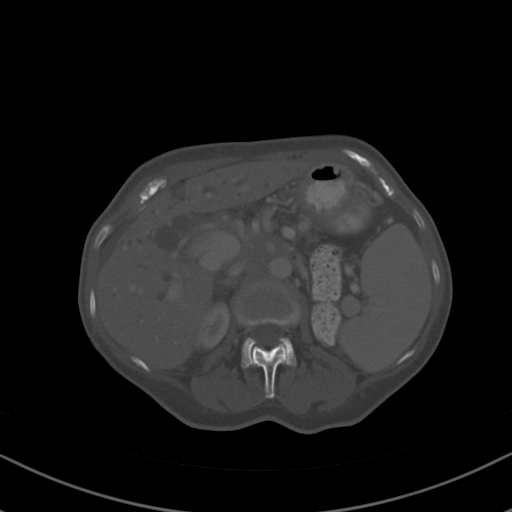
[im 61/79  soft-tissue]
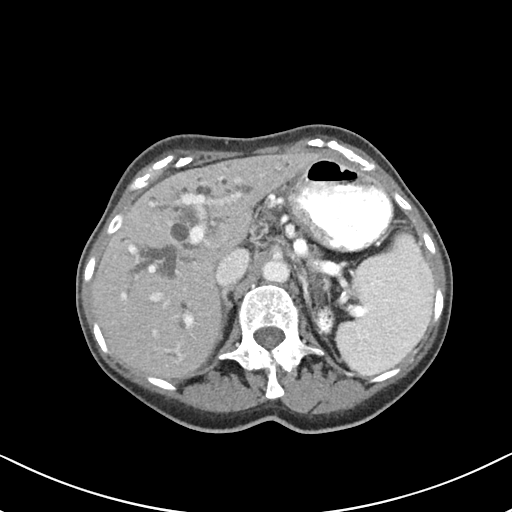
[im 66/79  soft-tissue]
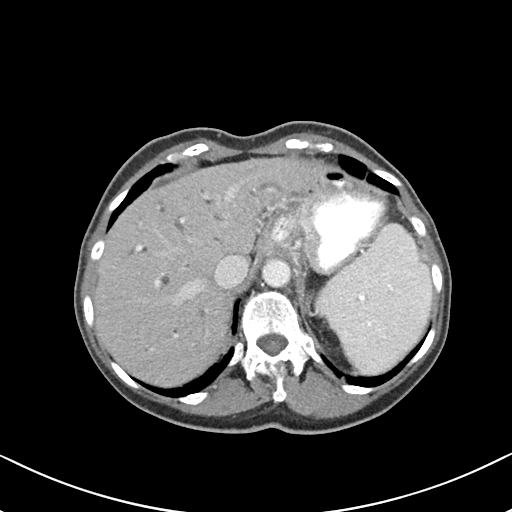
[im 74/79  soft-tissue]
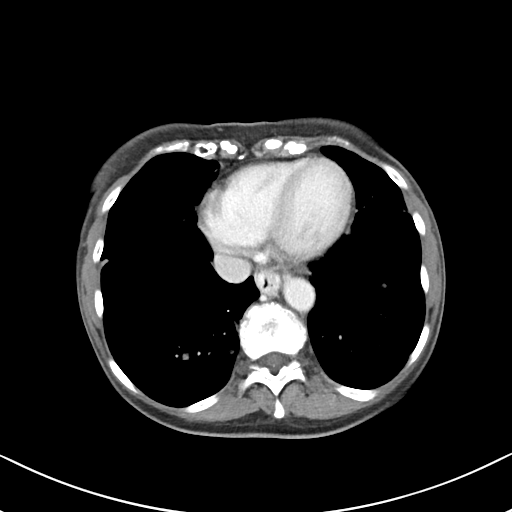

[Series 5: abd/pel w st · coronal · 0.78mm/px · 3 of 82 slices shown]
[im 28/82  soft-tissue]
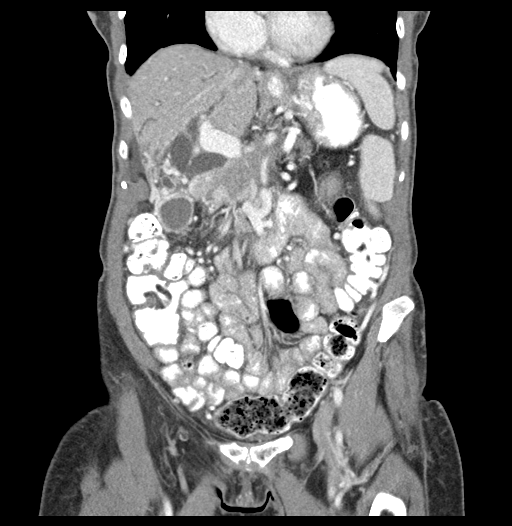
[im 37/82  soft-tissue]
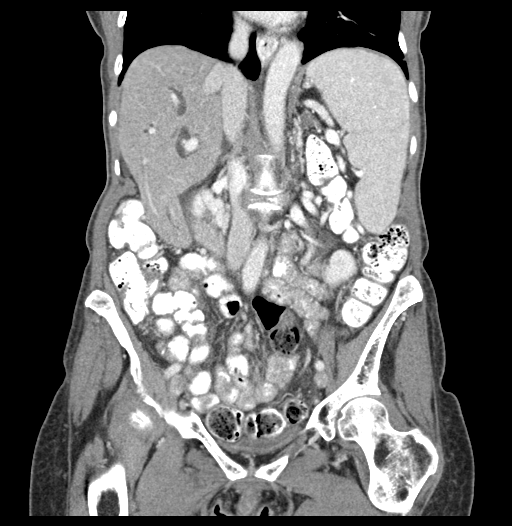
[im 46/82  soft-tissue]
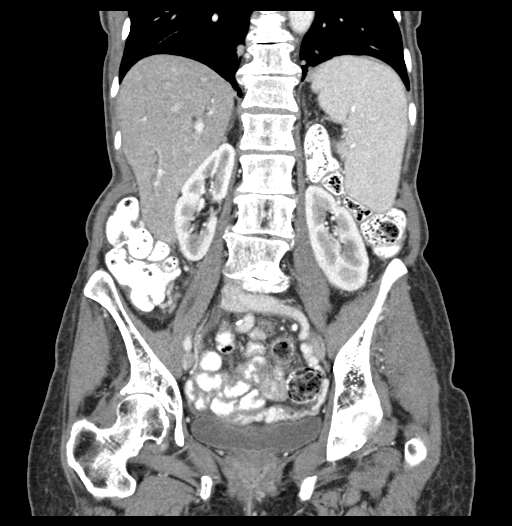

[15 of 46 positions shown; findings below may reference images not displayed]

FINDINGS: Lower chest: No pleural effusion. Numerous pulmonary nodules are
identified throughout both lungs. Index nodule in the right lower
lobe measures 1 cm, image 4 of series 3. Index nodule in the left
lower lobe measures 7 mm, image 9 of series 3.

Hepatobiliary: Moderate to marked intrahepatic bile duct dilatation.
No focal liver abnormality identified to suggest metastatic disease.
The proximal common bile duct measures 1.4 cm, image 25 of series 2.
There is an abrupt cut off at the level of the mid common bile duct
by pancreas mass.

Pancreas: There is a large mass centered around the expected
location of the head of pancreas which measures 3.8 by 2.6 by
cm. There is surrounding soft tissue infiltration which encases and
nearly completely occludes the portal vein and portal venous
confluence. Tumor extends into the aortocaval region and encases
both the celiac and superior mesenteric arteries. Atrophy of the
body and tail of pancreas noted..

Spleen: The spleen is measures 6.9 x 7.8 x 13.3 cm (volume = 370
cm^3)

Adrenals/Urinary Tract: Normal appearance of the adrenal glands. No
kidney mass or hydronephrosis. Small left lower pole cyst measures 8
mm. Urinary bladder appears normal.

Stomach/Bowel: The stomach appears normal. Tumor has mass effect
upon the medial wall of the descending duodenum. Cannot rule out
invasion. No gastric outlet obstruction. No abnormal small bowel
dilatation identified. The appendix is visualized and is normal.
Normal appearance of the colon.

Vascular/Lymphatic: Mild aortic atherosclerosis. Tumor encasement of
the celiac and superior mesenteric arteries noted. There is also
encasement and significant narrowing of the portal venous confluence
and extrahepatic portal vein. Dilated intrahepatic portal vein is
non thrombosed. Splenic vein remains patent. Small distal esophageal
varices and multiple upper abdominal collateral vessels identified.
Soft tissue infiltration within the proximal aortocaval region
measures 1.4 by 2.9 cm. No pelvic or inguinal adenopathy.

Reproductive: The uterus appears atrophic. No adnexal mass
identified.

Other: No ascites.  No peritoneal nodularity identified.

Musculoskeletal: Spondylosis identified within the lumbar spine.
There is an anterolisthesis of L4 on L5. No suspicious bone lesions.
IMPRESSION: 1. Large, aggressive appearing mass involving the head of pancreas
is identified with infiltration into the surrounding soft tissues.
There is evidence of vascular involvement the portal venous
confluence, extrahepatic portal vein, celiac trunk and superior
mesenteric artery. Mass completely occludes the common bile duct
resulting in marked intrahepatic biliary dilatation. There is also
mass effect upon the medial wall of the descending duodenum. Cannot
rule out invasion of the duodenum.
2. Numerous bilateral lower lobe pulmonary nodules compatible with
metastatic disease.
